# Patient Record
Sex: Female | Born: 1950 | Race: Black or African American | Hispanic: No | Smoking: Current every day smoker
Health system: Southern US, Community
[De-identification: ages and names within clinical notes are randomized; demographics above are authoritative.]

## PROBLEM LIST (undated history)

## (undated) DIAGNOSIS — I10 Essential (primary) hypertension: Secondary | ICD-10-CM

## (undated) DIAGNOSIS — I671 Cerebral aneurysm, nonruptured: Secondary | ICD-10-CM

## (undated) DIAGNOSIS — E119 Type 2 diabetes mellitus without complications: Secondary | ICD-10-CM

## (undated) HISTORY — DX: Essential (primary) hypertension: I10

## (undated) HISTORY — PX: OTHER SURGICAL HISTORY: SHX169

## (undated) HISTORY — PX: ABDOMINAL HYSTERECTOMY: SHX81

## (undated) HISTORY — DX: Type 2 diabetes mellitus without complications: E11.9

## (undated) HISTORY — DX: Cerebral aneurysm, nonruptured: I67.1

---

## 1998-06-08 DIAGNOSIS — I671 Cerebral aneurysm, nonruptured: Secondary | ICD-10-CM

## 1998-06-08 HISTORY — PX: BRAIN SURGERY: SHX531

## 1998-06-08 HISTORY — DX: Cerebral aneurysm, nonruptured: I67.1

## 1999-06-09 HISTORY — PX: VENTRICULOPERITONEAL SHUNT: SHX204

## 2013-03-07 ENCOUNTER — Encounter: Payer: Self-pay | Admitting: Family Medicine

## 2013-03-07 ENCOUNTER — Ambulatory Visit: Payer: Medicare Other | Attending: Family Medicine | Admitting: Family Medicine

## 2013-03-07 VITALS — BP 160/77 | HR 78 | Temp 98.7°F | Resp 18 | Ht 62.0 in | Wt 167.0 lb

## 2013-03-07 DIAGNOSIS — I10 Essential (primary) hypertension: Secondary | ICD-10-CM | POA: Insufficient documentation

## 2013-03-07 DIAGNOSIS — I671 Cerebral aneurysm, nonruptured: Secondary | ICD-10-CM

## 2013-03-07 DIAGNOSIS — E119 Type 2 diabetes mellitus without complications: Secondary | ICD-10-CM | POA: Insufficient documentation

## 2013-03-07 DIAGNOSIS — Z23 Encounter for immunization: Secondary | ICD-10-CM

## 2013-03-07 DIAGNOSIS — F17200 Nicotine dependence, unspecified, uncomplicated: Secondary | ICD-10-CM | POA: Insufficient documentation

## 2013-03-07 DIAGNOSIS — F411 Generalized anxiety disorder: Secondary | ICD-10-CM | POA: Insufficient documentation

## 2013-03-07 LAB — CBC
HCT: 39.3 % (ref 36.0–46.0)
Hemoglobin: 13.3 g/dL (ref 12.0–15.0)
MCH: 27.4 pg (ref 26.0–34.0)
MCHC: 33.8 g/dL (ref 30.0–36.0)
MCV: 81 fL (ref 78.0–100.0)
RDW: 14.7 % (ref 11.5–15.5)

## 2013-03-07 LAB — COMPLETE METABOLIC PANEL WITH GFR
AST: 11 U/L (ref 0–37)
Albumin: 4.5 g/dL (ref 3.5–5.2)
Alkaline Phosphatase: 103 U/L (ref 39–117)
BUN: 13 mg/dL (ref 6–23)
CO2: 31 mEq/L (ref 19–32)
Creat: 0.89 mg/dL (ref 0.50–1.10)
GFR, Est Non African American: 71 mL/min
Glucose, Bld: 95 mg/dL (ref 70–99)
Sodium: 140 mEq/L (ref 135–145)
Total Bilirubin: 0.3 mg/dL (ref 0.3–1.2)
Total Protein: 7.6 g/dL (ref 6.0–8.3)

## 2013-03-07 LAB — GLUCOSE, POCT (MANUAL RESULT ENTRY): POC Glucose: 188 mg/dl — AB (ref 70–99)

## 2013-03-07 LAB — LIPID PANEL
Cholesterol: 170 mg/dL (ref 0–200)
VLDL: 21 mg/dL (ref 0–40)

## 2013-03-07 NOTE — Progress Notes (Signed)
Patient ID: Jacqueline Dean, female   DOB: [birth year], 62 y.o.   MRN: [id number]  CC: Establish care  HPI: Patient is presenting as a new patient to the clinic.  She reports that her previous primary care physician has left town.  She reports that she has diabetes but is been well controlled with taking Actos 15 mg by mouth daily.  She reports that she has been taking her medications with no difficulty.  She reports that she hasn't had lab work done in quite some time.  It has been nearly one year.  She reports that she has a history of her brain aneurysm and had surgery and subsequently has been taking Dilantin every day.  This has been done to avoid seizures.  She reports that she has not experienced any seizure activity.  She reports that her previous physician changed the dose of her Dilantin somewhat but she's been taking it the same way for the past 15 years.  She did not change the dose.  She takes 4 tablets at night before bed.  The patient reports that she has not had any Dilantin levels done in many years.  She has not seen a neurologist and a very long time.  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  She reports that this has been working very well for her.    No Known Allergies Past Medical History  Diagnosis Date  . Diabetes mellitus without complication   . Hypertension   . Brain aneurysm 2000   No current outpatient prescriptions on file prior to visit.   No current facility-administered medications on file prior to visit.   Family History  Problem Relation Age of Onset  . Hypertension Mother   . Diabetes Mother   . Heart disease Mother   . Hypertension Sister    History   Social History  . Marital Status: Widowed    Spouse Name: N/A    Number of Children: N/A  . Years of Education: N/A   Occupational History  . Not on file.   Social History Main Topics  . Smoking status: Not  on file  . Smokeless tobacco: Not on file  . Alcohol Use: Not on file  . Drug Use: Not on file  . Sexual Activity: Not on file   Other Topics Concern  . Not on file   Social History Narrative  . No narrative on file    Review of Systems  Constitutional: Negative for fever, chills, diaphoresis, activity change, appetite change and fatigue.  HENT: Negative for ear pain, nosebleeds, congestion, facial swelling, rhinorrhea, neck pain, neck stiffness and ear discharge.   Eyes: Negative for pain, discharge, redness, itching and visual disturbance.  Respiratory: Negative for cough, choking, chest tightness, shortness of breath, wheezing and stridor.   Cardiovascular: Negative for chest pain, palpitations and leg swelling.  Gastrointestinal: Negative for abdominal distention.  Genitourinary: Negative for dysuria, urgency, frequency, hematuria, flank pain, decreased urine volume, difficulty urinating and dyspareunia.  Musculoskeletal: Negative for back pain, joint swelling, arthralgias and gait problem.  Neurological: Negative for dizziness, tremors, seizures, syncope, facial asymmetry, speech difficulty, weakness, light-headedness, numbness and headaches.  Hematological: Negative for adenopathy. Does not bruise/bleed easily.  Psychiatric/Behavioral: Negative for hallucinations, behavioral problems, confusion, dysphoric mood, decreased concentration and agitation.    Objective:   Filed Vitals:   03/07/13 1112  BP: 160/77  Pulse: 78  Temp: 98.7 F (37.1  C)  Resp: 18    Physical Exam  Constitutional: Appears well-developed and well-nourished. No distress.  HENT: Normocephalic. External right and left ear normal. Oropharynx is clear and moist.  Eyes: Conjunctivae and EOM are normal. PERRLA, no scleral icterus.  Neck: Normal ROM. Neck supple. No JVD. No tracheal deviation. No thyromegaly.  CVS: RRR, S1/S2 +, no murmurs, no gallops, no carotid bruit.  Pulmonary: Effort and breath sounds  normal, no stridor, rhonchi, wheezes, rales.  Abdominal: Soft. BS +,  no distension, tenderness, rebound or guarding.  Musculoskeletal: Normal range of motion. No edema and no tenderness.  Lymphadenopathy: No lymphadenopathy noted, cervical, inguinal. Neuro: Alert. Normal reflexes, muscle tone coordination. No cranial nerve deficit. Skin: Skin is warm and dry. No rash noted. Not diaphoretic. No erythema. No pallor.  Psychiatric: Normal mood and affect. Behavior, judgment, thought content normal.   No results found for this basename: WBC, HGB, HCT, MCV, PLT   No results found for this basename: CREATININE, BUN, NA, K, CL, CO2    Lab Results  Component Value Date   HGBA1C 5.7 03/07/2013   Lipid Panel  No results found for this basename: chol, trig, hdl, cholhdl, vldl, ldlcalc       Assessment and plan:   Patient Active Problem List   Diagnosis Date Noted  . Type II or unspecified type diabetes mellitus without mention of complication, not stated as uncontrolled 03/07/2013  . Unspecified essential hypertension 03/07/2013  . Brain aneurysm 03/07/2013  . Smoker 03/07/2013  . Generalized anxiety disorder 03/07/2013   Diabetes - Plan: HgB A1c, Glucose (CBG)  Type II or unspecified type diabetes mellitus without mention of complication, not stated as uncontrolled  Unspecified essential hypertension  Brain aneurysm  Smoker  Generalized anxiety disorder   The patient was counseled on the dangers of tobacco use, and was advised to quit.  Reviewed strategies to maximize success, including removing cigarettes and smoking materials from environment and stress management.  RTC in 3 weeks for repeat BP check and office visit  The patient was given clear instructions to go to ER or return to medical center if symptoms don't improve, worsen or new problems develop.  The patient verbalized understanding.  The patient was told to call to get any lab results if not heard anything in the  next week.    Rodney Langton, MD, CDE, FAAFP [facility] [place], [place]

## 2013-03-07 NOTE — Progress Notes (Signed)
Pt here to establish care  multiple medical history with medication Need a1c/cbg checked Medication changed and filled

## 2013-03-07 NOTE — Patient Instructions (Addendum)
Hypertension Hypertension is another name for high blood pressure. High blood pressure may mean that your heart needs to work harder to pump blood. Blood pressure consists of two numbers, which includes a higher number over a lower number (example: 110/72). HOME CARE   Make lifestyle changes as told by your doctor. This may include weight loss and exercise.  Take your blood pressure medicine every day.  Limit how much salt you use.  Stop smoking if you smoke.  Do not use drugs.  Talk to your doctor if you are using decongestants or birth control pills. These medicines might make blood pressure higher.  Females should not drink more than 1 alcoholic drink per day. Males should not drink more than 2 alcoholic drinks per day.  See your doctor as told. GET HELP RIGHT AWAY IF:   You have a blood pressure reading with a top number of 180 or higher.  You get a very bad headache.  You get blurred or changing vision.  You feel confused.  You feel weak, numb, or faint.  You get chest or belly (abdominal) pain.  You throw up (vomit).  You cannot breathe very well. MAKE SURE YOU:   Understand these instructions.  Will watch your condition.  Will get help right away if you are not doing well or get worse. Document Released: 11/11/2007 Document Revised: 08/17/2011 Document Reviewed: 11/11/2007 [facility] Patient Information 2014 [place], [place]. DASH Diet The DASH diet stands for "Dietary Approaches to Stop Hypertension." It is a healthy eating plan that has been shown to reduce high blood pressure (hypertension) in as little as 14 days, while also possibly providing other significant health benefits. These other health benefits include reducing the risk of breast cancer after menopause and reducing the risk of type 2 diabetes, heart disease, colon cancer, and stroke. Health benefits also include weight loss and slowing kidney failure in patients with chronic kidney disease.  DIET  GUIDELINES  Limit salt (sodium). Your diet should contain less than 1500 mg of sodium daily.  Limit refined or processed carbohydrates. Your diet should include mostly whole grains. Desserts and added sugars should be used sparingly.  Include small amounts of heart-healthy fats. These types of fats include nuts, oils, and tub margarine. Limit saturated and trans fats. These fats have been shown to be harmful in the body. CHOOSING FOODS  The following food groups are based on a 2000 calorie diet. See your Registered Dietitian for individual calorie needs. Grains and Grain Products (6 to 8 servings daily)  Eat More Often: Whole-wheat bread, brown rice, whole-grain or wheat pasta, quinoa, popcorn without added fat or salt (air popped).  Eat Less Often: White bread, white pasta, white rice, cornbread. Vegetables (4 to 5 servings daily)  Eat More Often: Fresh, frozen, and canned vegetables. Vegetables may be raw, steamed, roasted, or grilled with a minimal amount of fat.  Eat Less Often/Avoid: Creamed or fried vegetables. Vegetables in a cheese sauce. Fruit (4 to 5 servings daily)  Eat More Often: All fresh, canned (in natural juice), or frozen fruits. Dried fruits without added sugar. One hundred percent fruit juice ( cup [237 mL] daily).  Eat Less Often: Dried fruits with added sugar. Canned fruit in light or heavy syrup. [employer], Fish, and Poultry (2 servings or less daily. One serving is 3 to 4 oz [85-114 g]).  Eat More Often: Ninety percent or leaner ground beef, tenderloin, sirloin. Round cuts of beef, chicken breast, [place] breast. All fish. Grill, bake,  or broil your meat. Nothing should be fried.  Eat Less Often/Avoid: Fatty cuts of meat, [place], or chicken leg, thigh, or wing. Fried cuts of meat or fish. Dairy (2 to 3 servings)  Eat More Often: Low-fat or fat-free milk, low-fat plain or light yogurt, reduced-fat or part-skim cheese.  Eat Less Often/Avoid: Milk (whole,  2%).Whole milk yogurt. Full-fat cheeses. Nuts, Seeds, and Legumes (4 to 5 servings per week)  Eat More Often: All without added salt.  Eat Less Often/Avoid: Salted nuts and seeds, canned beans with added salt. Fats and Sweets (limited)  Eat More Often: Vegetable oils, tub margarines without trans fats, sugar-free gelatin. Mayonnaise and salad dressings.  Eat Less Often/Avoid: Coconut oils, palm oils, butter, stick margarine, cream, half and half, cookies, candy, pie. FOR MORE INFORMATION The Dash Diet Eating Plan: www.dashdiet.org Document Released: 05/14/2011 Document Revised: 08/17/2011 Document Reviewed: 05/14/2011 [facility] Patient Information 2014 [place], [place]. Smoking Cessation Quitting smoking is important to your health and has many advantages. However, it is not always easy to quit since nicotine is a very addictive drug. Often times, people try 3 times or more before being able to quit. This document explains the best ways for you to prepare to quit smoking. Quitting takes hard work and a lot of effort, but you can do it. ADVANTAGES OF QUITTING SMOKING  You will live longer, feel better, and live better.  Your body will feel the impact of quitting smoking almost immediately.  Within 20 minutes, blood pressure decreases. Your pulse returns to its normal level.  After 8 hours, carbon monoxide levels in the blood return to normal. Your oxygen level increases.  After 24 hours, the chance of having a heart attack starts to decrease. Your breath, hair, and body stop smelling like smoke.  After 48 hours, damaged nerve endings begin to recover. Your sense of taste and smell improve.  After 72 hours, the body is virtually free of nicotine. Your bronchial tubes relax and breathing becomes easier.  After 2 to 12 weeks, lungs can hold more air. Exercise becomes easier and circulation improves.  The risk of having a heart attack, stroke, cancer, or lung disease is greatly  reduced.  After 1 year, the risk of coronary heart disease is cut in half.  After 5 years, the risk of stroke falls to the same as a nonsmoker.  After 10 years, the risk of lung cancer is cut in half and the risk of other cancers decreases significantly.  After 15 years, the risk of coronary heart disease drops, usually to the level of a nonsmoker.  If you are pregnant, quitting smoking will improve your chances of having a healthy baby.  The people you live with, especially any children, will be healthier.  You will have extra money to spend on things other than cigarettes. QUESTIONS TO THINK ABOUT BEFORE ATTEMPTING TO QUIT You may want to talk about your answers with your caregiver.  Why do you want to quit?  If you tried to quit in the past, what helped and what did not?  What will be the most difficult situations for you after you quit? How will you plan to handle them?  Who can help you through the tough times? Your family? Friends? A caregiver?  What pleasures do you get from smoking? What ways can you still get pleasure if you quit? Here are some questions to ask your caregiver:  How can you help me to be successful at quitting?  What medicine do  you think would be best for me and how should I take it?  What should I do if I need more help?  What is smoking withdrawal like? How can I get information on withdrawal? GET READY  Set a quit date.  Change your environment by getting rid of all cigarettes, ashtrays, matches, and lighters in your home, car, or work. Do not let people smoke in your home.  Review your past attempts to quit. Think about what worked and what did not. GET SUPPORT AND ENCOURAGEMENT You have a better chance of being successful if you have help. You can get support in many ways.  Tell your family, friends, and co-workers that you are going to quit and need their support. Ask them not to smoke around you.  Get individual, group, or telephone  counseling and support. Programs are available at [employer] and health centers. Call your local health department for information about programs in your area.  Spiritual beliefs and practices may help some smokers quit.  Download a "quit meter" on your computer to keep track of quit statistics, such as how long you have gone without smoking, cigarettes not smoked, and money saved.  Get a self-help book about quitting smoking and staying off of tobacco. LEARN NEW SKILLS AND BEHAVIORS  Distract yourself from urges to smoke. Talk to someone, go for a walk, or occupy your time with a task.  Change your normal routine. Take a different route to work. Drink tea instead of coffee. Eat breakfast in a different place.  Reduce your stress. Take a hot bath, exercise, or read a book.  Plan something enjoyable to do every day. Reward yourself for not smoking.  Explore interactive web-based programs that specialize in helping you quit. GET MEDICINE AND USE IT CORRECTLY Medicines can help you stop smoking and decrease the urge to smoke. Combining medicine with the above behavioral methods and support can greatly increase your chances of successfully quitting smoking.  Nicotine replacement therapy helps deliver nicotine to your body without the negative effects and risks of smoking. Nicotine replacement therapy includes nicotine gum, lozenges, inhalers, nasal sprays, and skin patches. Some may be available over-the-counter and others require a prescription.  Antidepressant medicine helps people abstain from smoking, but how this works is unknown. This medicine is available by prescription.  Nicotinic receptor partial agonist medicine simulates the effect of nicotine in your brain. This medicine is available by prescription. Ask your caregiver for advice about which medicines to use and how to use them based on your health history. Your caregiver will tell you what side effects to look out for if you  choose to be on a medicine or therapy. Carefully read the information on the package. Do not use any other product containing nicotine while using a nicotine replacement product.  RELAPSE OR DIFFICULT SITUATIONS Most relapses occur within the first 3 months after quitting. Do not be discouraged if you start smoking again. Remember, most people try several times before finally quitting. You may have symptoms of withdrawal because your body is used to nicotine. You may crave cigarettes, be irritable, feel very hungry, cough often, get headaches, or have difficulty concentrating. The withdrawal symptoms are only temporary. They are strongest when you first quit, but they will go away within 10 14 days. To reduce the chances of relapse, try to:  Avoid drinking alcohol. Drinking lowers your chances of successfully quitting.  Reduce the amount of caffeine you consume. Once you quit smoking, the amount  of caffeine in your body increases and can give you symptoms, such as a rapid heartbeat, sweating, and anxiety.  Avoid smokers because they can make you want to smoke.  Do not let weight gain distract you. Many smokers will gain weight when they quit, usually less than 10 pounds. Eat a healthy diet and stay active. You can always lose the weight gained after you quit.  Find ways to improve your mood other than smoking. FOR MORE INFORMATION  www.smokefree.gov  Document Released: 05/19/2001 Document Revised: 11/24/2011 Document Reviewed: 09/03/2011 [facility] Patient Information 2014 [place], [place]. Smoking Cessation, Tips for Success YOU CAN QUIT SMOKING If you are ready to quit smoking, congratulations! You have chosen to help yourself be healthier. Cigarettes bring nicotine, tar, carbon monoxide, and other irritants into your body. Your lungs, heart, and blood vessels will be able to work better without these poisons. There are many different ways to quit smoking. Nicotine gum, nicotine patches, a  nicotine inhaler, or nicotine nasal spray can help with physical craving. Hypnosis, support groups, and medicines help break the habit of smoking. Here are some tips to help you quit for good.  Throw away all cigarettes.  Clean and remove all ashtrays from your home, work, and car.  On a card, write down your reasons for quitting. Carry the card with you and read it when you get the urge to smoke.  Cleanse your body of nicotine. Drink enough water and fluids to keep your urine clear or pale yellow. Do this after quitting to flush the nicotine from your body.  Learn to predict your moods. Do not let a bad situation be your excuse to have a cigarette. Some situations in your life might tempt you into wanting a cigarette.  Never have "just one" cigarette. It leads to wanting another and another. Remind yourself of your decision to quit.  Change habits associated with smoking. If you smoked while driving or when feeling stressed, try other activities to replace smoking. Stand up when drinking your coffee. Brush your teeth after eating. Sit in a different chair when you read the paper. Avoid alcohol while trying to quit, and try to drink fewer caffeinated beverages. Alcohol and caffeine may urge you to smoke.  Avoid foods and drinks that can trigger a desire to smoke, such as sugary or spicy foods and alcohol.  Ask people who smoke not to smoke around you.  Have something planned to do right after eating or having a cup of coffee. Take a walk or exercise to perk you up. This will help to keep you from overeating.  Try a relaxation exercise to calm you down and decrease your stress. Remember, you may be tense and nervous for the first 2 weeks after you quit, but this will pass.  Find new activities to keep your hands busy. Play with a pen, coin, or rubber band. Doodle or draw things on paper.  Brush your teeth right after eating. This will help cut down on the craving for the taste of tobacco  after meals. You can try mouthwash, too.  Use oral substitutes, such as lemon drops, carrots, a cinnamon stick, or chewing gum, in place of cigarettes. Keep them handy so they are available when you have the urge to smoke.  When you have the urge to smoke, try deep breathing.  Designate your home as a nonsmoking area.  If you are a heavy smoker, ask your caregiver about a prescription for nicotine chewing gum. It can ease  your withdrawal from nicotine.  Reward yourself. Set aside the cigarette money you save and buy yourself something nice.  Look for support from others. Join a support group or smoking cessation program. Ask someone at home or at work to help you with your plan to quit smoking.  Always ask yourself, "Do I need this cigarette or is this just a reflex?" Tell yourself, "Today, I choose not to smoke," or "I do not want to smoke." You are reminding yourself of your decision to quit, even if you do smoke a cigarette. HOW WILL I FEEL WHEN I QUIT SMOKING?  The benefits of not smoking start within days of quitting.  You may have symptoms of withdrawal because your body is used to nicotine (the addictive substance in cigarettes). You may crave cigarettes, be irritable, feel very hungry, cough often, get headaches, or have difficulty concentrating.  The withdrawal symptoms are only temporary. They are strongest when you first quit but will go away within 10 to 14 days.  When withdrawal symptoms occur, stay in control. Think about your reasons for quitting. Remind yourself that these are signs that your body is healing and getting used to being without cigarettes.  Remember that withdrawal symptoms are easier to treat than the major diseases that smoking can cause.  Even after the withdrawal is over, expect periodic urges to smoke. However, these cravings are generally short-lived and will go away whether you smoke or not. Do not smoke!  If you relapse and smoke again, do not lose  hope. Most smokers quit 3 times before they are successful.  If you relapse, do not give up! Plan ahead and think about what you will do the next time you get the urge to smoke. LIFE AS A NONSMOKER: MAKE IT FOR A MONTH, MAKE IT FOR LIFE Day 1: Hang this page where you will see it every day. Day 2: Get rid of all ashtrays, matches, and lighters. Day 3: Drink water. Breathe deeply between sips. Day 4: Avoid places with smoke-filled air, such as bars, clubs, or the smoking section of restaurants. Day 5: Keep track of how much money you save by not smoking. Day 6: Avoid boredom. Keep a good book with you or go to the movies. Day 7: Reward yourself! One week without smoking! Day 8: Make a dental appointment to get your teeth cleaned. Day 9: Decide how you will turn down a cigarette before it is offered to you. Day 10: Review your reasons for quitting. Day 11: Distract yourself. Stay active to keep your mind off smoking and to relieve tension. Take a walk, exercise, read a book, do a crossword puzzle, or try a new hobby. Day 12: Exercise. Get off the bus before your stop or use stairs instead of escalators. Day 13: Call on friends for support and encouragement. Day 14: Reward yourself! Two weeks without smoking! Day 15: Practice deep breathing exercises. Day 16: Bet a friend that you can stay a nonsmoker. Day 17: Ask to sit in nonsmoking sections of restaurants. Day 18: Hang up "No Smoking" signs. Day 19: Think of yourself as a nonsmoker. Day 20: Each morning, tell yourself you will not smoke. Day 21: Reward yourself! Three weeks without smoking! Day 22: Think of smoking in negative ways. Remember how it stains your teeth, gives you bad breath, and leaves you short of breath. Day 23: Eat a nutritious breakfast. Day 24:Do not relive your days as a smoker. Day 25: Hold a pencil in your  hand when talking on the telephone. Day 26: Tell all your friends you do not smoke. Day 27: Think about how  much better food tastes. Day 28: Remember, one cigarette is one too many. Day 29: Take up a hobby that will keep your hands busy. Day 30: Congratulations! One month without smoking! Give yourself a big reward. Your caregiver can direct you to community resources or hospitals for support, which may include:  Group support.  Education.  Hypnosis.  Subliminal therapy. Document Released: 02/21/2004 Document Revised: 08/17/2011 Document Reviewed: 03/11/2009 [facility] Patient Information 2014 [place], [place].

## 2013-03-08 LAB — MICROALBUMIN / CREATININE URINE RATIO: Creatinine, Urine: 92.7 mg/dL

## 2013-03-08 LAB — TSH: TSH: 1.177 u[IU]/mL (ref 0.350–4.500)

## 2013-03-12 LAB — PHENYTOIN LEVEL, FREE AND TOTAL
Phenytoin, Free: <0.5 mg/L — ABNORMAL LOW (ref 1.0–2.0)
Phenytoin, Total: <2.5 mg/L — ABNORMAL LOW (ref 10.0–20.0)

## 2013-03-15 ENCOUNTER — Telehealth: Payer: Self-pay | Admitting: Emergency Medicine

## 2013-03-15 NOTE — Telephone Encounter (Signed)
Message copied by Darlis Loan on Wed Mar 15, 2013 11:50 AM ------      Message from: Cleora Fleet      Created: Wed Mar 15, 2013 11:24 AM       Please inform patient that her labs came back ok except that her dilantin levels came back very low.  I am wondering if patient is taking the dilantin or if patient has missed some doses.              Rodney Langton, MD, CDE, FAAFP      [facility]      [facility]      [place], [place]        ------

## 2013-03-15 NOTE — Progress Notes (Signed)
Spoke with regarding labs and low dilantin levels. States she was out of medication x 1 week but has restarted 03/10/13. Denies seizure activity

## 2013-03-15 NOTE — Progress Notes (Signed)
Quick Note:  Please inform patient that her labs came back ok except that her dilantin levels came back very low. I am wondering if patient is taking the dilantin or if patient has missed some doses.   Rodney Langton, MD, CDE, FAAFP [facility] [place], [place]   ______

## 2013-03-15 NOTE — Telephone Encounter (Signed)
Spoke with pt regarding low Dilantin levels. States she was out of medicine x 1 week but has restarted 03/10/13 No seizures noted

## 2013-03-28 ENCOUNTER — Ambulatory Visit: Payer: PRIVATE HEALTH INSURANCE | Attending: Internal Medicine | Admitting: Internal Medicine

## 2013-03-28 VITALS — BP 126/77 | HR 90 | Temp 98.8°F | Resp 16 | Ht 64.0 in | Wt 168.0 lb

## 2013-03-28 DIAGNOSIS — I10 Essential (primary) hypertension: Secondary | ICD-10-CM | POA: Insufficient documentation

## 2013-03-28 DIAGNOSIS — Z0000 Encounter for general adult medical examination without abnormal findings: Secondary | ICD-10-CM

## 2013-03-28 DIAGNOSIS — E119 Type 2 diabetes mellitus without complications: Secondary | ICD-10-CM | POA: Insufficient documentation

## 2013-03-28 NOTE — Progress Notes (Signed)
Pt is here for a f/u visit. Pt is here for diabetes management. Pt has no C.C. today

## 2013-03-28 NOTE — Progress Notes (Signed)
Patient ID: Jacqueline Dean, female   DOB: July 09, 1950, 62 y.o.   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 Blood work done that showed subtherapeutic Dilantin level and was called  for followup. It appears that she was not taking her Dilantin regularly and had not seen a physician or her neurologist for a long time. Blood work showed hemoglobin A1c of 5.7. Her blood pressure was also elevated at that time and it appears she was not taking her medications on to that visit. She has now been regular with her medications and her blood pressure is quite stable today. She denies any headache, blurred vision, dizziness, tingling or numbness of her extremities, chest pain, palpitations, shortness of breath, bowel or urinary symptoms, abdominal pain, nausea or vomiting.  Vital signs in last 24 hours:  Filed Vitals:   03/28/13 0910  BP: 126/77  Pulse: 90  Temp: 98.8 F (37.1 C)  TempSrc: Oral  Resp: 16  Height: 5\' 4"  (1.626 m)  Weight: 168 lb (76.204 kg)  SpO2: 97%     Physical Exam:  General: Middle aged female in no acute distress. HEENT: no pallor, no icterus, moist oral mucosa,  Heart: Normal  s1 &s2  Regular rate and rhythm,  Lungs: Clear to auscultation bilaterally. Extremities: Warm, no edema Neuro: Alert, awake, oriented x3, nonfocal.   Lab Results:  Basic Metabolic Panel:    Component Value Date/Time   NA 140 03/07/2013 1138   K 3.9 03/07/2013 1138   CL 103 03/07/2013 1138   CO2 31 03/07/2013 1138   BUN 13 03/07/2013 1138   CREATININE 0.89 03/07/2013 1138   GLUCOSE 95 03/07/2013 1138   CALCIUM 10.1 03/07/2013 1138   CBC:    Component Value Date/Time   WBC 5.2 03/07/2013 1138   HGB 13.3 03/07/2013 1138   HCT 39.3 03/07/2013 1138   PLT 295 03/07/2013 1138   MCV 81.0 03/07/2013 1138    No results found for this or any previous visit (from the  past 240 hour(s)).  Studies/Results: No results found.  Medications: Scheduled Meds: Continuous Infusions: PRN Meds:.    Assessment/Plan: Subtherapeutic Dilantin level Route recheck Dilantin level today. Patient reports being compliant with her medications since her last visit 3 weeks back. If level not therapeutic we will call her to adjust her  dose.  Diabetes mellitus A1c of 5.7. Continue  metformin  Hypertension BP is stable today. Continue losartan  Health maintenance Patient reports being up-to-date with her Pap and mammogram. She also received a flu vaccine recently. Followup in 3 months.   Fedor Kazmierski 03/28/2013, 9:56 AM

## 2013-04-01 LAB — PHENYTOIN LEVEL, FREE AND TOTAL
Phenytoin Bound: 6.2 mg/L
Phenytoin, Free: 0.8 mg/L — ABNORMAL LOW (ref 1.0–2.0)

## 2013-06-17 ENCOUNTER — Emergency Department (HOSPITAL_COMMUNITY): Payer: PRIVATE HEALTH INSURANCE

## 2013-06-17 ENCOUNTER — Emergency Department (HOSPITAL_COMMUNITY)
Admission: EM | Admit: 2013-06-17 | Discharge: 2013-06-17 | Disposition: A | Discharge status: Home / Self Care | Payer: PRIVATE HEALTH INSURANCE | Attending: Emergency Medicine | Admitting: Emergency Medicine

## 2013-06-17 ENCOUNTER — Encounter (HOSPITAL_COMMUNITY): Payer: Self-pay | Admitting: Emergency Medicine

## 2013-06-17 DIAGNOSIS — M542 Cervicalgia: Secondary | ICD-10-CM

## 2013-06-17 DIAGNOSIS — Z79899 Other long term (current) drug therapy: Secondary | ICD-10-CM | POA: Insufficient documentation

## 2013-06-17 DIAGNOSIS — Y9389 Activity, other specified: Secondary | ICD-10-CM | POA: Insufficient documentation

## 2013-06-17 DIAGNOSIS — S199XXA Unspecified injury of neck, initial encounter: Principal | ICD-10-CM

## 2013-06-17 DIAGNOSIS — E119 Type 2 diabetes mellitus without complications: Secondary | ICD-10-CM | POA: Insufficient documentation

## 2013-06-17 DIAGNOSIS — Y92410 Unspecified street and highway as the place of occurrence of the external cause: Secondary | ICD-10-CM | POA: Insufficient documentation

## 2013-06-17 DIAGNOSIS — R51 Headache: Secondary | ICD-10-CM

## 2013-06-17 DIAGNOSIS — F17200 Nicotine dependence, unspecified, uncomplicated: Secondary | ICD-10-CM | POA: Insufficient documentation

## 2013-06-17 DIAGNOSIS — Z982 Presence of cerebrospinal fluid drainage device: Secondary | ICD-10-CM | POA: Insufficient documentation

## 2013-06-17 DIAGNOSIS — S0993XA Unspecified injury of face, initial encounter: Secondary | ICD-10-CM | POA: Insufficient documentation

## 2013-06-17 DIAGNOSIS — I10 Essential (primary) hypertension: Secondary | ICD-10-CM | POA: Insufficient documentation

## 2013-06-17 DIAGNOSIS — R519 Headache, unspecified: Secondary | ICD-10-CM

## 2013-06-17 DIAGNOSIS — S0990XA Unspecified injury of head, initial encounter: Secondary | ICD-10-CM | POA: Insufficient documentation

## 2013-06-17 MED ORDER — HYDROCODONE-ACETAMINOPHEN 5-325 MG PO TABS: 1.0000 | ORAL_TABLET | Freq: Four times a day (QID) | ORAL | Status: DC | PRN
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
## 2013-06-17 NOTE — ED Notes (Signed)
MD at bedside. 
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
## 2013-06-17 NOTE — ED Notes (Signed)
Patient transported to CT 
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
## 2013-06-28 ENCOUNTER — Ambulatory Visit: Payer: Medicare Other | Admitting: Internal Medicine

## 2013-08-18 ENCOUNTER — Ambulatory Visit: Payer: PRIVATE HEALTH INSURANCE | Admitting: Diagnostic Neuroimaging

## 2013-10-21 ENCOUNTER — Inpatient Hospital Stay (HOSPITAL_COMMUNITY)
Admission: EM | Admit: 2013-10-21 | Discharge: 2013-10-24 | DRG: 378 | Disposition: A | Discharge status: Home / Self Care | Payer: PRIVATE HEALTH INSURANCE | Attending: Family Medicine | Admitting: Family Medicine

## 2013-10-21 ENCOUNTER — Encounter (HOSPITAL_COMMUNITY): Payer: Self-pay | Admitting: Emergency Medicine

## 2013-10-21 ENCOUNTER — Emergency Department (INDEPENDENT_AMBULATORY_CARE_PROVIDER_SITE_OTHER)
Admission: EM | Admit: 2013-10-21 | Discharge: 2013-10-21 | Disposition: A | Discharge status: Nursing Home (Includes ICF) | Payer: PRIVATE HEALTH INSURANCE | Source: Home / Self Care | Attending: Emergency Medicine | Admitting: Emergency Medicine

## 2013-10-21 DIAGNOSIS — F411 Generalized anxiety disorder: Secondary | ICD-10-CM | POA: Diagnosis present

## 2013-10-21 DIAGNOSIS — R358 Other polyuria: Secondary | ICD-10-CM | POA: Diagnosis present

## 2013-10-21 DIAGNOSIS — D649 Anemia, unspecified: Secondary | ICD-10-CM

## 2013-10-21 DIAGNOSIS — I671 Cerebral aneurysm, nonruptured: Secondary | ICD-10-CM

## 2013-10-21 DIAGNOSIS — K921 Melena: Principal | ICD-10-CM | POA: Diagnosis present

## 2013-10-21 DIAGNOSIS — K5900 Constipation, unspecified: Secondary | ICD-10-CM | POA: Diagnosis present

## 2013-10-21 DIAGNOSIS — D62 Acute posthemorrhagic anemia: Secondary | ICD-10-CM | POA: Diagnosis present

## 2013-10-21 DIAGNOSIS — D126 Benign neoplasm of colon, unspecified: Secondary | ICD-10-CM

## 2013-10-21 DIAGNOSIS — Z8249 Family history of ischemic heart disease and other diseases of the circulatory system: Secondary | ICD-10-CM

## 2013-10-21 DIAGNOSIS — K648 Other hemorrhoids: Secondary | ICD-10-CM | POA: Diagnosis present

## 2013-10-21 DIAGNOSIS — Z833 Family history of diabetes mellitus: Secondary | ICD-10-CM

## 2013-10-21 DIAGNOSIS — I10 Essential (primary) hypertension: Secondary | ICD-10-CM

## 2013-10-21 DIAGNOSIS — R3589 Other polyuria: Secondary | ICD-10-CM | POA: Diagnosis present

## 2013-10-21 DIAGNOSIS — K922 Gastrointestinal hemorrhage, unspecified: Secondary | ICD-10-CM

## 2013-10-21 DIAGNOSIS — Z823 Family history of stroke: Secondary | ICD-10-CM

## 2013-10-21 DIAGNOSIS — Z79899 Other long term (current) drug therapy: Secondary | ICD-10-CM

## 2013-10-21 DIAGNOSIS — Z7982 Long term (current) use of aspirin: Secondary | ICD-10-CM

## 2013-10-21 DIAGNOSIS — E119 Type 2 diabetes mellitus without complications: Secondary | ICD-10-CM | POA: Diagnosis present

## 2013-10-21 DIAGNOSIS — Z982 Presence of cerebrospinal fluid drainage device: Secondary | ICD-10-CM

## 2013-10-21 DIAGNOSIS — F17200 Nicotine dependence, unspecified, uncomplicated: Secondary | ICD-10-CM | POA: Diagnosis present

## 2013-10-21 LAB — COMPREHENSIVE METABOLIC PANEL
ALBUMIN: 4 g/dL (ref 3.5–5.2)
ALK PHOS: 116 U/L (ref 39–117)
ALT: 21 U/L (ref 0–35)
AST: 18 U/L (ref 0–37)
BUN: 9 mg/dL (ref 6–23)
CO2: 24 mEq/L (ref 19–32)
Calcium: 9.4 mg/dL (ref 8.4–10.5)
Chloride: 99 mEq/L (ref 96–112)
Creatinine, Ser: 0.69 mg/dL (ref 0.50–1.10)
GFR calc Af Amer: >90 mL/min (ref >90)
GFR calc non Af Amer: >90 mL/min (ref >90)
Glucose, Bld: 112 mg/dL — ABNORMAL HIGH (ref 70–99)
POTASSIUM: 3.9 meq/L (ref 3.7–5.3)
Sodium: 136 mEq/L — ABNORMAL LOW (ref 137–147)
TOTAL PROTEIN: 8 g/dL (ref 6.0–8.3)
Total Bilirubin: <0.2 mg/dL — ABNORMAL LOW (ref 0.3–1.2)

## 2013-10-21 LAB — CBC
HCT: 21.8 % — ABNORMAL LOW (ref 36.0–46.0)
Hemoglobin: 6.8 g/dL — CL (ref 12.0–15.0)
MCH: 25.2 pg — ABNORMAL LOW (ref 26.0–34.0)
MCHC: 31.2 g/dL (ref 30.0–36.0)
MCV: 80.7 fL (ref 78.0–100.0)
Platelets: 592 10*3/uL — ABNORMAL HIGH (ref 150–400)
RBC: 2.7 MIL/uL — ABNORMAL LOW (ref 3.87–5.11)
RDW: 15.5 % (ref 11.5–15.5)
WBC: 8.1 10*3/uL (ref 4.0–10.5)

## 2013-10-21 LAB — APTT: APTT: 31 s (ref 24–37)

## 2013-10-21 LAB — PHENYTOIN LEVEL, TOTAL: Phenytoin Lvl: 10.9 ug/mL (ref 10.0–20.0)

## 2013-10-21 LAB — POCT I-STAT, CHEM 8
BUN: 7 mg/dL (ref 6–23)
CALCIUM ION: 1.21 mmol/L (ref 1.13–1.30)
Chloride: 104 mEq/L (ref 96–112)
Creatinine, Ser: 0.8 mg/dL (ref 0.50–1.10)
GLUCOSE: 101 mg/dL — AB (ref 70–99)
HEMATOCRIT: 23 % — AB (ref 36.0–46.0)
Hemoglobin: 7.8 g/dL — ABNORMAL LOW (ref 12.0–15.0)
Potassium: 3.9 mEq/L (ref 3.7–5.3)
Sodium: 138 mEq/L (ref 137–147)
TCO2: 23 mmol/L (ref 0–100)

## 2013-10-21 LAB — PREPARE RBC (CROSSMATCH)

## 2013-10-21 LAB — ABO/RH: ABO/RH(D): O POS

## 2013-10-21 LAB — PROTIME-INR
INR: 1.04 (ref 0.00–1.49)
Prothrombin Time: 13.4 seconds (ref 11.6–15.2)

## 2013-10-21 MED ORDER — SODIUM CHLORIDE 0.9 % IV SOLN
80.0000 mg | Freq: Once | INTRAVENOUS | Status: AC
  Administered 2013-10-21: 80 mg via INTRAVENOUS
  Filled 2013-10-21: qty 80

## 2013-10-21 MED ORDER — INSULIN ASPART 100 UNIT/ML ~~LOC~~ SOLN
0.0000 [IU] | SUBCUTANEOUS | Status: DC
  Administered 2013-10-22: 1 [IU] via SUBCUTANEOUS
  Filled 2013-10-21: qty 1

## 2013-10-21 MED ORDER — PHENYTOIN SODIUM EXTENDED 100 MG PO CAPS: 100.0000 mg | ORAL_CAPSULE | Freq: Two times a day (BID) | ORAL | Status: DC

## 2013-10-21 MED ORDER — PHENYTOIN SODIUM EXTENDED 100 MG PO CAPS
100.0000 mg | ORAL_CAPSULE | Freq: Every day | ORAL | Status: DC
  Administered 2013-10-22 – 2013-10-23 (×2): 100 mg via ORAL
  Filled 2013-10-21 (×3): qty 1

## 2013-10-21 MED ORDER — SODIUM CHLORIDE 0.9 % IJ SOLN
3.0000 mL | Freq: Two times a day (BID) | INTRAMUSCULAR | Status: DC
  Administered 2013-10-22 – 2013-10-23 (×3): 3 mL via INTRAVENOUS

## 2013-10-21 MED ORDER — DILTIAZEM HCL ER COATED BEADS 240 MG PO CP24
240.0000 mg | ORAL_CAPSULE | Freq: Every day | ORAL | Status: DC
  Administered 2013-10-22 – 2013-10-24 (×3): 240 mg via ORAL
  Filled 2013-10-21 (×3): qty 1

## 2013-10-21 MED ORDER — SODIUM CHLORIDE 0.9 % IV SOLN
8.0000 mg/h | INTRAVENOUS | Status: DC
  Administered 2013-10-21 – 2013-10-22 (×2): 8 mg/h via INTRAVENOUS
  Filled 2013-10-21 (×3): qty 80

## 2013-10-21 MED ORDER — BUSPIRONE HCL 15 MG PO TABS
15.0000 mg | ORAL_TABLET | Freq: Three times a day (TID) | ORAL | Status: DC
  Administered 2013-10-21 – 2013-10-24 (×8): 15 mg via ORAL
  Filled 2013-10-21 (×10): qty 1

## 2013-10-21 MED ORDER — PHENYTOIN SODIUM EXTENDED 100 MG PO CAPS
400.0000 mg | ORAL_CAPSULE | Freq: Every day | ORAL | Status: DC
  Administered 2013-10-21 – 2013-10-23 (×3): 400 mg via ORAL
  Filled 2013-10-21 (×4): qty 4

## 2013-10-21 NOTE — ED Notes (Signed)
She was sent from [facility] for rectal bleeding. She states her doctor started her on daily ASA several months ago and has noticed "pink tinged stool" since. She went to [facility] today for fatigue and they found her HGB and HCT were decreased. She is A&Ox4, no pain

## 2013-10-21 NOTE — ED Provider Notes (Signed)
CSN: [id number]     Arrival date & time 10/21/13  1151 History   First MD Initiated Contact with Patient 10/21/13 1220     Chief Complaint  Patient presents with  . Weakness   (Consider location/radiation/quality/duration/timing/severity/associated sxs/prior Treatment) HPI  63 year old F who presents with a chief complaint of fatigue and constipation. This has been going on since Tuesday, and she believes that this is related to her potassium. The patient states that she has had problems with potassium and her past and had similar symptoms. It has not occurred for several years. She was previously taking potassium supplements. When asked about additional symptoms, the patient responded "a little bit of everything." I asked her to be more specific and she stated she is also having cramps in her lower airways, sweating, and frequent urination. She also notes very dark stool, but denies rectal bleeding. She does not have a uterus due to partial hysterectomy in 1990 for fibroid tumor. She denies nausea, vomiting, fevers, chest pain, shortness of breath, or dysuria.   Past Medical History  Diagnosis Date  . Diabetes mellitus without complication   . Hypertension   . Brain aneurysm 2000   Past Surgical History  Procedure Laterality Date  . Abdominal hysterectomy      partial  . Brain surgery  2000  . Goiter    . Ventriculoperitoneal shunt  2001   Family History  Problem Relation Age of Onset  . Hypertension Mother   . Diabetes Mother   . Heart disease Mother   . Hypertension Sister    History  Substance Use Topics  . Smoking status: Current Every Day Smoker    Types: Cigarettes  . Smokeless tobacco: Not on file  . Alcohol Use: No   OB History   Grav Para Term Preterm Abortions TAB SAB Ect Mult Living                 Review of Systems She denies nausea, vomiting, fevers, chest pain, shortness of breath, or dysuria.  Allergies  Review of patient's allergies indicates no  known allergies.  Home Medications   Prior to Admission medications   Medication Sig Start Date End Date Taking? Authorizing Provider  busPIRone (BUSPAR) 15 MG tablet Take 15 mg by mouth 3 (three) times daily.    Historical Provider, MD  diltiazem (CARDIZEM CD) 240 MG 24 hr capsule Take 240 mg by mouth daily.    Historical Provider, MD  hydrochlorothiazide (HYDRODIURIL) 25 MG tablet Take 25 mg by mouth daily.    Historical Provider, MD  HYDROcodone-acetaminophen (NORCO/VICODIN) 5-325 MG per tablet Take 1 tablet by mouth every 6 (six) hours as needed (for severe pain). 06/17/13   Elmer Sow, MD  losartan (COZAAR) 50 MG tablet Take 50 mg by mouth daily.    Historical Provider, MD  phenytoin (DILANTIN) 100 MG ER capsule Take by mouth 2 (two) times daily.    Historical Provider, MD  pioglitazone (ACTOS) 30 MG tablet Take 30 mg by mouth daily.    Historical Provider, MD   BP 115/52  Pulse 89  Temp(Src) 98.8 F (37.1 C) (Oral)  Resp 18  SpO2 100% Physical Exam Gen: milddle aged Keyser female, well appearing, NAD, HEENT: NCAT, PERRLA, EOMI, OP clear and moist, no oropharyngeal exudate, no lymphadenopathy, no thyroid tenderness, enlargement, or nodules,  CV: RRR, no m/r/g, no JVD or carotid bruits Pulm: normal WOB, CTA-B Abd: distended, palpable stool burden, non tender Rectal: lots of stool in rectal  vault, melena Extremities: no edema or joint tenderness Skin: warm, dry, no rashes Neuro/Psych: A&Ox4, normal affect, speech, and thought content  ED Course  Procedures (including critical care time) Labs Review Labs Reviewed  POCT I-STAT, CHEM 8 - Abnormal; Notable for the following:    Glucose, Bld 101 (*)    Hemoglobin 7.8 (*)    HCT 23.0 (*)    All other components within normal limits    Imaging Review No results found.   MDM   1. GI bleed   2. Anemia   3. Constipation    Severe anemia with melena and heme + stool in patient greater than 74 years old is colon cancer until  proven otherwise. I will send her to the [facility] ED for a CT scan and CBC check. This may be due to aspirin use causing upper GI bleed and/or a confounding effect of the dilantin, but more importantly, the patient needs a thorough GI evaluation. This situation was discussed in detail with the patient, and she is agreeable to going to the ED.     Angelica Ran, MD 10/21/13 1440

## 2013-10-21 NOTE — ED Notes (Signed)
CBG 85

## 2013-10-21 NOTE — ED Notes (Signed)
Pt  Reports        Symptoms   Of  Weakness         She  thnks  Her potassium  May  Be  Low    She  Also  Reports   Being  Constipated  As  Well

## 2013-10-21 NOTE — Progress Notes (Signed)
  Pt admitted to the unit. Pt is stable, alert and oriented per baseline. Oriented to room, staff, and call bell. Educated to call for any assistance. Bed in lowest position, call bell within reach- will continue to monitor. 
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
## 2013-10-21 NOTE — ED Notes (Signed)
Attempted to call report.  RN Bailey Mech transferring 2 patients and will return call.

## 2013-10-22 ENCOUNTER — Encounter (HOSPITAL_COMMUNITY): Payer: Self-pay | Admitting: General Practice

## 2013-10-22 ENCOUNTER — Encounter (HOSPITAL_COMMUNITY)
Admission: EM | Disposition: A | Discharge status: Home / Self Care | Payer: Self-pay | Source: Home / Self Care | Attending: Family Medicine

## 2013-10-22 DIAGNOSIS — D649 Anemia, unspecified: Secondary | ICD-10-CM

## 2013-10-22 DIAGNOSIS — K922 Gastrointestinal hemorrhage, unspecified: Secondary | ICD-10-CM

## 2013-10-22 HISTORY — PX: ESOPHAGOGASTRODUODENOSCOPY: SHX5428

## 2013-10-22 LAB — TYPE AND SCREEN
ABO/RH(D): O POS
Antibody Screen: NEGATIVE
Unit division: 0
Unit division: 0

## 2013-10-22 LAB — CBC
HEMATOCRIT: 27.2 % — AB (ref 36.0–46.0)
Hemoglobin: 8.8 g/dL — ABNORMAL LOW (ref 12.0–15.0)
MCH: 26.3 pg (ref 26.0–34.0)
MCHC: 32.4 g/dL (ref 30.0–36.0)
MCV: 81.4 fL (ref 78.0–100.0)
Platelets: 478 10*3/uL — ABNORMAL HIGH (ref 150–400)
RBC: 3.34 MIL/uL — AB (ref 3.87–5.11)
RDW: 14.9 % (ref 11.5–15.5)
WBC: 7.6 10*3/uL (ref 4.0–10.5)

## 2013-10-22 LAB — GLUCOSE, CAPILLARY
GLUCOSE-CAPILLARY: 114 mg/dL — AB (ref 70–99)
GLUCOSE-CAPILLARY: 92 mg/dL (ref 70–99)
GLUCOSE-CAPILLARY: 115 mg/dL — AB (ref 70–99)
GLUCOSE-CAPILLARY: 102 mg/dL — AB (ref 70–99)
GLUCOSE-CAPILLARY: 83 mg/dL (ref 70–99)
Glucose-Capillary: 136 mg/dL — ABNORMAL HIGH (ref 70–99)
Glucose-Capillary: 69 mg/dL — ABNORMAL LOW (ref 70–99)
Glucose-Capillary: 91 mg/dL (ref 70–99)

## 2013-10-22 LAB — COMPREHENSIVE METABOLIC PANEL
ALBUMIN: 3.5 g/dL (ref 3.5–5.2)
ALK PHOS: 111 U/L (ref 39–117)
ALT: 18 U/L (ref 0–35)
AST: 17 U/L (ref 0–37)
BUN: 8 mg/dL (ref 6–23)
CO2: 25 mEq/L (ref 19–32)
Calcium: 9.3 mg/dL (ref 8.4–10.5)
Chloride: 106 mEq/L (ref 96–112)
Creatinine, Ser: 0.64 mg/dL (ref 0.50–1.10)
GFR calc non Af Amer: >90 mL/min (ref >90)
Glucose, Bld: 95 mg/dL (ref 70–99)
POTASSIUM: 4 meq/L (ref 3.7–5.3)
Sodium: 143 mEq/L (ref 137–147)
TOTAL PROTEIN: 7.1 g/dL (ref 6.0–8.3)
Total Bilirubin: <0.2 mg/dL — ABNORMAL LOW (ref 0.3–1.2)

## 2013-10-22 SURGERY — EGD (ESOPHAGOGASTRODUODENOSCOPY)
Anesthesia: Moderate Sedation

## 2013-10-22 MED ORDER — FENTANYL CITRATE 0.05 MG/ML IJ SOLN
INTRAMUSCULAR | Status: AC
  Filled 2013-10-22: qty 2

## 2013-10-22 MED ORDER — PEG-KCL-NACL-NASULF-NA ASC-C 100 G PO SOLR
1.0000 | Freq: Once | ORAL | Status: AC
  Administered 2013-10-22: 200 g via ORAL
  Filled 2013-10-22: qty 1

## 2013-10-22 MED ORDER — SODIUM CHLORIDE 0.9 % IV SOLN
INTRAVENOUS | Status: DC
  Administered 2013-10-22: 500 mL via INTRAVENOUS

## 2013-10-22 MED ORDER — INSULIN ASPART 100 UNIT/ML ~~LOC~~ SOLN: 0.0000 [IU] | Freq: Three times a day (TID) | SUBCUTANEOUS | Status: DC

## 2013-10-22 MED ORDER — SODIUM CHLORIDE 0.9 % IV SOLN
INTRAVENOUS | Status: DC
  Administered 2013-10-23 (×2): via INTRAVENOUS

## 2013-10-22 MED ORDER — MIDAZOLAM HCL 5 MG/ML IJ SOLN
INTRAMUSCULAR | Status: AC
  Filled 2013-10-22: qty 2

## 2013-10-22 MED ORDER — PNEUMOCOCCAL VAC POLYVALENT 25 MCG/0.5ML IJ INJ
0.5000 mL | INJECTION | INTRAMUSCULAR | Status: AC
  Administered 2013-10-23: 0.5 mL via INTRAMUSCULAR
  Filled 2013-10-22: qty 0.5

## 2013-10-22 MED ORDER — FENTANYL CITRATE 0.05 MG/ML IJ SOLN
INTRAMUSCULAR | Status: DC | PRN
  Administered 2013-10-22 (×2): 25 ug via INTRAVENOUS

## 2013-10-22 MED ORDER — NICOTINE 21 MG/24HR TD PT24
21.0000 mg | MEDICATED_PATCH | Freq: Every day | TRANSDERMAL | Status: DC
  Administered 2013-10-22 – 2013-10-23 (×2): 21 mg via TRANSDERMAL
  Filled 2013-10-22 (×3): qty 1

## 2013-10-22 MED ORDER — BUTAMBEN-TETRACAINE-BENZOCAINE 2-2-14 % EX AERO
INHALATION_SPRAY | CUTANEOUS | Status: DC | PRN
  Administered 2013-10-22: 2 via TOPICAL

## 2013-10-22 MED ORDER — MIDAZOLAM HCL 10 MG/2ML IJ SOLN
INTRAMUSCULAR | Status: DC | PRN
  Administered 2013-10-22: 2 mg via INTRAVENOUS
  Administered 2013-10-22: 1 mg via INTRAVENOUS

## 2013-10-22 NOTE — Op Note (Signed)
[facility] [place], [id number]   ENDOSCOPY PROCEDURE REPORT  PATIENT: Jacqueline Dean, Jacqueline Dean  MR#: [id number] BIRTHDATE: [birth year] , 61  yrs. old GENDER: Female ENDOSCOPIST: Milus Banister, MD REFERRED BY:  [facility] PROCEDURE DATE:  10/22/2013 PROCEDURE:  EGD, diagnostic ASA CLASS:     Class II INDICATIONS:  black stools, anemia. MEDICATIONS: Fentanyl 50 mcg IV and Versed 3 mg IV TOPICAL ANESTHETIC: Cetacaine Spray  DESCRIPTION OF PROCEDURE: After the risks benefits and alternatives of the procedure were thoroughly explained, informed consent was obtained.  The PENTAX GASTOROSCOPE [id number] endoscope was introduced through the mouth and advanced to the second portion of the duodenum. Without limitations.  The instrument was slowly withdrawn as the mucosa was fully examined.   The upper, middle and distal third of the esophagus were carefully inspected and no abnormalities were noted.  The z-line was well seen at the GEJ.  The endoscope was pushed into the fundus which was normal including a retroflexed view.  The antrum, gastric body, first and second part of the duodenum were unremarkable. Retroflexed views revealed no abnormalities.     The scope was then withdrawn from the patient and the procedure completed. COMPLICATIONS: There were no complications.  ENDOSCOPIC IMPRESSION: Normal EGD. These findings do not explain her black stools, anemia.  RECOMMENDATIONS: Will proceed with colonoscopy tomorrow.   eSigned:  Milus Banister, MD 10/22/2013 8:23 AM

## 2013-10-22 NOTE — Progress Notes (Signed)
Hypoglycemic Event  CBG: 69  Treatment: 15 GM carbohydrate snack  Symptoms: None  Follow-up CBG: Time:2146 CBG Result:83  Possible Reasons for Event: Inadequate meal intake  Comments/MD notified:NP, Schorr    Jacqueline Dean  Remember to initiate Hypoglycemia Order Set & complete

## 2013-10-22 NOTE — H&P (Addendum)
Note: This document was prepared with digital dictation and possible smart phrase technology. Any transcriptional errors that result from this process are unintentional.   Jacqueline Dean ULA:[id number] DOB: 10/10/1950 DOA: 10/21/2013 PCP: [name], MD  Brief narrative: 63 y/o ?, known history brain aneurysm 2000 status post coiling, hypertension, diabetes mellitus non-insulin-dependent, partial hyst in 1990, and no other significant medical history is came to urgent care Center today with one to 2 weeks of malaise Heme occult positive/melanotic stool, hemoglobin 7.8 Transferred to inpatient as acute GI bleed   Past medical history-As per Problem list Chart reviewed as below- Review  Consultants:  Gastroenterology  Procedures:  Upper endoscopy 5/17 no findings  Antibiotics:  None   Subjective  Doing fair. No dark stools now Tolerating clears No abdominal pain no nausea   Objective    Interim History: None  Telemetry: Sinus   Objective: Filed Vitals:   10/22/13 0824 10/22/13 0830 10/22/13 0840 10/22/13 0849  BP: 126/51 129/46 126/53 138/53  Pulse: 84 81 79 76  Temp: 98.7 F (37.1 C)     TempSrc: Oral     Resp: _0 Height:      Weight:      SpO2: 100% 100% 100% 100%    Intake/Output Summary (Last 24 hours) at 10/22/13 1223 Last data filed at 10/22/13 2122  Gross per 24 hour  Intake 1117.92 ml  Output      0 ml  Net 1117.92 ml    Exam:  General: Alert pleasant oriented Cardiovascular: S1-S2 no murmur gallop Respiratory: Clinically clear Abdomen: Soft nontender no rebound or guarding Skin no lower extremity edema Neuro intact  Data Reviewed: Basic Metabolic Panel:  Recent Labs Lab 10/21/13 1412 10/21/13 1526 10/22/13 0359  NA 138 136* 143  K 3.9 3.9 4.0  CL 104 99 106  CO2  --  24 25  GLUCOSE 101* 112* 95  BUN _1 CREATININE 0.80 0.69 0.64  CALCIUM  --  9.4 9.3   Liver Function Tests:  Recent Labs Lab  10/21/13 1526 10/22/13 0359  AST 18 17  ALT 21 18  ALKPHOS 116 111  BILITOT <0.2* <0.2*  PROT 8.0 7.1  ALBUMIN 4.0 3.5   No results found for this basename: LIPASE, AMYLASE,  in the last 168 hours No results found for this basename: AMMONIA,  in the last 168 hours CBC:  Recent Labs Lab 10/21/13 1412 10/21/13 1526 10/22/13 0359  WBC  --  8.1 7.6  HGB 7.8* 6.8* 8.8*  HCT 23.0* 21.8* 27.2*  MCV  --  80.7 81.4  PLT  --  592* 478*   Cardiac Enzymes: No results found for this basename: CKTOTAL, CKMB, CKMBINDEX, TROPONINI,  in the last 168 hours BNP: No components found with this basename: POCBNP,  CBG:  Recent Labs Lab 10/21/13 2103 10/22/13 0038 10/22/13 0430 10/22/13 0757 10/22/13 1203  GLUCAP 114* 136* 92 115* 102*    No results found for this or any previous visit (from the past 240 hour(s)).   Studies:              All Imaging reviewed and is as per above notation   Scheduled Meds: . busPIRone  15 mg Oral TID  . diltiazem  240 mg Oral Daily  . insulin aspart  0-9 Units Subcutaneous 6 times per day  . peg 3350 powder  1 kit Oral Once  . phenytoin  100 mg Oral Daily  . phenytoin  400 mg Oral QHS  . [START ON 10/23/2013] pneumococcal 23 valent vaccine  0.5 mL Intramuscular Tomorrow-1000  . sodium chloride  3 mL Intravenous Q12H   Continuous Infusions:    Assessment/Plan: 1. GI bleed-likely lower GI source. 4 colonoscopy in a.m.. 2. Acute blood loss anemia-secondary to #1. Transfuse 2 units 5/16. Posttransfusion hematocrit stable. Repeat blood counts every morning 3. History aneurysm and coiling 2000-stable currently. 4. Non-insulin-dependent diabetes mellitus-transition from every 4-4 times a day coverage without basal insulin for now as poor by mouth intake. Blood sugars 90-136. 5. Anxiety-continue buspar 10 tid 6. Htn-not hypotensive and actually hypertensive so re-start diltiazem 240 daily.  Code Status: Full Family Communication: daughters + and  aware Disposition Plan: inpatient   Verneita Griffes, MD  [facility] Pager [phone] 10/22/2013, 12:23 PM    LOS: 1 day

## 2013-10-22 NOTE — Interval H&P Note (Signed)
History and Physical Interval Note:  10/22/2013 7:46 AM  Jacqueline Dean  has presented today for surgery, with the diagnosis of gi bleed  The various methods of treatment have been discussed with the patient and family. After consideration of risks, benefits and other options for treatment, the patient has consented to  Procedure(s): ESOPHAGOGASTRODUODENOSCOPY (EGD) (N/A) as a surgical intervention .  The patient's history has been reviewed, patient examined, no change in status, stable for surgery.  I have reviewed the patient's chart and labs.  Questions were answered to the patient's satisfaction.     Milus Banister

## 2013-10-22 NOTE — Progress Notes (Signed)
Patient had stool and called RN into room to see the content of it. Toilet was red but it seemed to be more light red then dark, like blood. Patient admitted to eating red jello and red icy this afternoon. Charge nurse took a second look at stool and agreed that it did not look like blood. Will continue to monitor

## 2013-10-22 NOTE — ED Provider Notes (Signed)
CSN: [id number]     Arrival date & time 10/21/13  1507 History   First MD Initiated Contact with Patient 10/21/13 1613     Chief Complaint  Patient presents with  . GI Bleeding     (Consider location/radiation/quality/duration/timing/severity/associated sxs/prior Treatment) The history is provided by the patient.   patient presents with GI bleed. She's had fatigue. She was sent down from urgent care. She's had some red and black stool since being started on aspirin 3 weeks ago. She's had increasing fatigue. Minimal abdominal pain. Past Medical History  Diagnosis Date  . Diabetes mellitus without complication   . Hypertension   . Brain aneurysm 2000   Past Surgical History  Procedure Laterality Date  . Abdominal hysterectomy      partial  . Brain surgery  2000  . Goiter    . Ventriculoperitoneal shunt  2001   Family History  Problem Relation Age of Onset  . Hypertension Mother   . Diabetes Mother   . Heart disease Mother   . Hypertension Sister   . Stroke Mother     Bed at age 41  . Heart attack Father     Died at age 36 with this   History  Substance Use Topics  . Smoking status: Current Every Day Smoker    Types: Cigarettes  . Smokeless tobacco: Not on file  . Alcohol Use: No   OB History   Grav Para Term Preterm Abortions TAB SAB Ect Mult Living                 Review of Systems  Constitutional: Positive for fatigue. Negative for activity change and appetite change.  Eyes: Negative for pain.  Respiratory: Negative for chest tightness and shortness of breath.   Cardiovascular: Negative for chest pain and leg swelling.  Gastrointestinal: Positive for blood in stool. Negative for nausea, vomiting, abdominal pain and diarrhea.  Genitourinary: Negative for flank pain.  Musculoskeletal: Negative for back pain and neck stiffness.  Skin: Negative for rash.  Neurological: Positive for light-headedness. Negative for weakness, numbness and headaches.   Psychiatric/Behavioral: Negative for behavioral problems.      Allergies  Review of patient's allergies indicates no known allergies.  Home Medications   Prior to Admission medications   Medication Sig Start Date End Date Taking? Authorizing Provider  aspirin 81 MG tablet Take 81 mg by mouth daily.   Yes Historical Provider, MD  busPIRone (BUSPAR) 15 MG tablet Take 15 mg by mouth 3 (three) times daily.   Yes Historical Provider, MD  diltiazem (CARDIZEM CD) 240 MG 24 hr capsule Take 240 mg by mouth daily.   Yes Historical Provider, MD  losartan-hydrochlorothiazide (HYZAAR) 100-12.5 MG per tablet Take 1 tablet by mouth daily.   Yes Historical Provider, MD  Multiple Vitamin (MULTIVITAMIN WITH MINERALS) TABS tablet Take 1 tablet by mouth daily.   Yes Historical Provider, MD  phenytoin (DILANTIN) 100 MG ER capsule Take 100-400 mg by mouth 2 (two) times daily. 100mg  in am, and 400mg  at bedtime   Yes Historical Provider, MD   BP 113/73  Pulse 81  Temp(Src) 98.3 F (36.8 C) (Oral)  Resp 20  Ht 5\' 2"  (1.575 m)  Wt 166 lb 7.2 oz (75.5 kg)  BMI 30.44 kg/m2  SpO2 96% Physical Exam  Nursing note and vitals reviewed. Constitutional: She is oriented to person, place, and time. She appears well-developed and well-nourished.  HENT:  Head: Normocephalic and atraumatic.  Eyes: EOM are normal. Pupils  are equal, round, and reactive to light.  Neck: Normal range of motion. Neck supple.  Cardiovascular: Normal rate, regular rhythm and normal heart sounds.   No murmur heard. Pulmonary/Chest: Effort normal and breath sounds normal. No respiratory distress. She has no wheezes. She has no rales.  Abdominal: Soft. Bowel sounds are normal. She exhibits no distension. There is no tenderness. There is no rebound and no guarding.  Musculoskeletal: Normal range of motion.  Neurological: She is alert and oriented to person, place, and time. No cranial nerve deficit.  Skin: Skin is warm and dry.   Psychiatric: She has a normal mood and affect. Her speech is normal.    ED Course  Procedures (including critical care time) Labs Review Labs Reviewed  CBC - Abnormal; Notable for the following:    RBC 2.70 (*)    Hemoglobin 6.8 (*)    HCT 21.8 (*)    MCH 25.2 (*)    Platelets 592 (*)    All other components within normal limits  COMPREHENSIVE METABOLIC PANEL - Abnormal; Notable for the following:    Sodium 136 (*)    Glucose, Bld 112 (*)    Total Bilirubin <0.2 (*)    All other components within normal limits  PROTIME-INR  APTT  PHENYTOIN LEVEL, TOTAL  COMPREHENSIVE METABOLIC PANEL  CBC  TYPE AND SCREEN  ABO/RH  PREPARE RBC (CROSSMATCH)    Imaging Review No results found.   EKG Interpretation None      MDM   Final diagnoses:  GI bleed  Anemia     patient presents with GI bleed. Seen in urgent care and sent here. Has anemia. Has melanotic stool. May be related to aspirin. Will admit to internal medicine with GI consult    Jasper Riling. Alvino Chapel, MD 10/22/13 0315

## 2013-10-22 NOTE — Progress Notes (Signed)
2 units given, patient denied any pain, chills, or n/v. No fevers persisted, will continue to monitor

## 2013-10-22 NOTE — Consult Note (Signed)
[facility] Referring Provider: [facility] Primary Care Physician:  [name], MD Primary Gastroenterologist:  None  Reason for Consultation: GI bleed   HPI:  Jacqueline Dean is a 63 y.o. female who noted dark stools for the past week. Black, but not loose or more frequent than usual.  +mild abd pains that are usually improved with eating, epigastric.  No nausea or vomiting.  No GERD symptoms. She takes no NSAIDs and only started 81mg  ASA 2 weeks ago.  No weight loss.  Has never had colonoscopy or upper endoscopy.  Became fatigued and went to urgent clinic, found to be anemic and sent to ER.    No CP, no SOB, no dizziness.    Hb was 6.8, received 2 units blood and Hb 8.8 this AM. Was started on IV PPI gtts.   Past Medical History  Diagnosis Date  . Diabetes mellitus without complication   . Hypertension   . Brain aneurysm 2000    Past Surgical History  Procedure Laterality Date  . Abdominal hysterectomy      partial  . Brain surgery  2000  . Goiter    . Ventriculoperitoneal shunt  2001    Prior to Admission medications   Medication Sig Start Date End Date Taking? Authorizing Provider  aspirin 81 MG tablet Take 81 mg by mouth daily.   Yes Historical Provider, MD  busPIRone (BUSPAR) 15 MG tablet Take 15 mg by mouth 3 (three) times daily.   Yes Historical Provider, MD  diltiazem (CARDIZEM CD) 240 MG 24 hr capsule Take 240 mg by mouth daily.   Yes Historical Provider, MD  losartan-hydrochlorothiazide (HYZAAR) 100-12.5 MG per tablet Take 1 tablet by mouth daily.   Yes Historical Provider, MD  Multiple Vitamin (MULTIVITAMIN WITH MINERALS) TABS tablet Take 1 tablet by mouth daily.   Yes Historical Provider, MD  phenytoin (DILANTIN) 100 MG ER capsule Take 100-400 mg by mouth 2 (two) times daily. 100mg  in am, and 400mg  at bedtime   Yes Historical Provider, MD    Current Facility-Administered Medications  Medication Dose Route Frequency Provider Last Rate  Last Dose  . busPIRone (BUSPAR) tablet 15 mg  15 mg Oral TID [name], MD   15 mg at 10/21/13 2157  . diltiazem (CARDIZEM CD) 24 hr capsule 240 mg  240 mg Oral Daily [name], MD      . insulin aspart (novoLOG) injection 0-9 Units  0-9 Units Subcutaneous 6 times per day [name], MD   1 Units at 10/22/13 0046  . pantoprazole (PROTONIX) 80 mg in sodium chloride 0.9 % 250 mL infusion  8 mg/hr Intravenous Continuous [name], MD 25 mL/hr at 10/22/13 0519 8 mg/hr at 10/22/13 0519  . phenytoin (DILANTIN) ER capsule 100 mg  100 mg Oral Daily Rande Lawman Rumbarger, RPH      . phenytoin (DILANTIN) ER capsule 400 mg  400 mg Oral QHS Rande Lawman Rumbarger, RPH   400 mg at 10/21/13 2158  . [START ON 10/23/2013] pneumococcal 23 valent vaccine (PNU-IMMUNE) injection 0.5 mL  0.5 mL Intramuscular Tomorrow-1000 Jai-Gurmukh Samtani, MD      . sodium chloride 0.9 % injection 3 mL  3 mL Intravenous Q12H [name], MD        Allergies as of 10/21/2013  . (No Known Allergies)    Family History  Problem Relation Age of Onset  . Hypertension Mother   . Diabetes Mother   . Heart disease Mother   . Hypertension Sister   .  Stroke Mother     Bed at age 10  . Heart attack Father     Died at age 48 with this    History   Social History  . Marital Status: Widowed    Spouse Name: N/A    Number of Children: N/A  . Years of Education: N/A   Occupational History  . Not on file.   Social History Main Topics  . Smoking status: Current Every Day Smoker    Types: Cigarettes  . Smokeless tobacco: Not on file  . Alcohol Use: No  . Drug Use: No  . Sexual Activity: Not on file   Other Topics Concern  . Not on file   Social History Narrative   Currently disabled since may of 2000 when she had her aneurysm surgery in [place] coiling of the ureter. 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 She smokes pot half pack per day but has been smoking less  recently   She drinks occasionally   She does not do drugs      Gynecological history G0 P0 secondary to fibroidectomy 1990     Review of Systems: Pertinent positive and negative review of systems were noted in the above HPI section. Complete review of systems was performed and was otherwise normal.   Physical Exam: Vital signs in last 24 hours: Temp:  [97.4 F (36.3 C)-99.3 F (37.4 C)] 98.1 F (36.7 C) (05/17 0118) Pulse Rate:  [77-97] 77 (05/17 0118) Resp:  [16-22] 18 (05/17 0032) BP: (108-164)/(52-86) 115/74 mmHg (05/17 0118) SpO2:  [96 %-100 %] 98 % (05/17 0118) Weight:  [165 lb (74.844 kg)-166 lb 7.2 oz (75.5 kg)] 166 lb 7.2 oz (75.5 kg) (05/16 1911) Last BM Date: 10/21/13 Constitutional: generally well-appearing Psychiatric: alert and oriented x3 Eyes: extraocular movements intact Mouth: oral pharynx moist, no lesions Neck: supple no lymphadenopathy Cardiovascular: heart regular rate and rhythm Lungs: clear to auscultation bilaterally Abdomen: soft, nontender, nondistended, no obvious ascites, no peritoneal signs, normal bowel sounds Extremities: no lower extremity edema bilaterally Skin: no lesions on visible extremities   Lab Results:  Recent Labs  10/21/13 1412 10/21/13 1526 10/22/13 0359  WBC  --  8.1 7.6  HGB 7.8* 6.8* 8.8*  HCT 23.0* 21.8* 27.2*  PLT  --  592* 478*  MCV  --  80.7 81.4   BMET  Recent Labs  10/21/13 1412 10/21/13 1526 10/22/13 0359  NA 138 136* 143  K 3.9 3.9 4.0  CL 104 99 106  CO2  --  24 25  GLUCOSE 101* 112* 95  BUN 7 9 8   CREATININE 0.80 0.69 0.64  CALCIUM  --  9.4 9.3   LFT  Recent Labs  10/22/13 0359  BILITOT <0.2*  AST 17  ALT 18  ALKPHOS 111  PROT 7.1  ALBUMIN 3.5   PT/INR  Recent Labs  10/21/13 1654  LABPROT 13.4  INR 1.04     Impression/Plan: 63 y.o. female with GI bleed  Melena usually is usually from UGI bleeding however her BUN is normal and so this may be lower source.  Will start  workup with EGD this AM.  If no clear etiology then she will need colonoscopy tomorrow.     Milus Banister, MD  10/22/2013, 6:50 AM [facility] Pager [phone]

## 2013-10-22 NOTE — H&P (View-Only) (Signed)
[facility] Referring Provider: [facility] Primary Care Physician:  [name], MD Primary Gastroenterologist:  None  Reason for Consultation: GI bleed   HPI:  Jacqueline Dean is a 63 y.o. female who noted dark stools for the past week. Black, but not loose or more frequent than usual.  +mild abd pains that are usually improved with eating, epigastric.  No nausea or vomiting.  No GERD symptoms. She takes no NSAIDs and only started 81mg  ASA 2 weeks ago.  No weight loss.  Has never had colonoscopy or upper endoscopy.  Became fatigued and went to urgent clinic, found to be anemic and sent to ER.    No CP, no SOB, no dizziness.    Hb was 6.8, received 2 units blood and Hb 8.8 this AM. Was started on IV PPI gtts.   Past Medical History  Diagnosis Date  . Diabetes mellitus without complication   . Hypertension   . Brain aneurysm 2000    Past Surgical History  Procedure Laterality Date  . Abdominal hysterectomy      partial  . Brain surgery  2000  . Goiter    . Ventriculoperitoneal shunt  2001    Prior to Admission medications   Medication Sig Start Date End Date Taking? Authorizing Provider  aspirin 81 MG tablet Take 81 mg by mouth daily.   Yes Historical Provider, MD  busPIRone (BUSPAR) 15 MG tablet Take 15 mg by mouth 3 (three) times daily.   Yes Historical Provider, MD  diltiazem (CARDIZEM CD) 240 MG 24 hr capsule Take 240 mg by mouth daily.   Yes Historical Provider, MD  losartan-hydrochlorothiazide (HYZAAR) 100-12.5 MG per tablet Take 1 tablet by mouth daily.   Yes Historical Provider, MD  Multiple Vitamin (MULTIVITAMIN WITH MINERALS) TABS tablet Take 1 tablet by mouth daily.   Yes Historical Provider, MD  phenytoin (DILANTIN) 100 MG ER capsule Take 100-400 mg by mouth 2 (two) times daily. 100mg  in am, and 400mg  at bedtime   Yes Historical Provider, MD    Current Facility-Administered Medications  Medication Dose Route Frequency Provider Last Rate  Last Dose  . busPIRone (BUSPAR) tablet 15 mg  15 mg Oral TID [name], MD   15 mg at 10/21/13 2157  . diltiazem (CARDIZEM CD) 24 hr capsule 240 mg  240 mg Oral Daily [name], MD      . insulin aspart (novoLOG) injection 0-9 Units  0-9 Units Subcutaneous 6 times per day [name], MD   1 Units at 10/22/13 0046  . pantoprazole (PROTONIX) 80 mg in sodium chloride 0.9 % 250 mL infusion  8 mg/hr Intravenous Continuous [name], MD 25 mL/hr at 10/22/13 0519 8 mg/hr at 10/22/13 0519  . phenytoin (DILANTIN) ER capsule 100 mg  100 mg Oral Daily Rande Lawman Rumbarger, RPH      . phenytoin (DILANTIN) ER capsule 400 mg  400 mg Oral QHS Rande Lawman Rumbarger, RPH   400 mg at 10/21/13 2158  . [START ON 10/23/2013] pneumococcal 23 valent vaccine (PNU-IMMUNE) injection 0.5 mL  0.5 mL Intramuscular Tomorrow-1000 Jai-Gurmukh Samtani, MD      . sodium chloride 0.9 % injection 3 mL  3 mL Intravenous Q12H [name], MD        Allergies as of 10/21/2013  . (No Known Allergies)    Family History  Problem Relation Age of Onset  . Hypertension Mother   . Diabetes Mother   . Heart disease Mother   . Hypertension Sister   .  Stroke Mother     Bed at age 10  . Heart attack Father     Died at age 48 with this    History   Social History  . Marital Status: Widowed    Spouse Name: N/A    Number of Children: N/A  . Years of Education: N/A   Occupational History  . Not on file.   Social History Main Topics  . Smoking status: Current Every Day Smoker    Types: Cigarettes  . Smokeless tobacco: Not on file  . Alcohol Use: No  . Drug Use: No  . Sexual Activity: Not on file   Other Topics Concern  . Not on file   Social History Narrative   Currently disabled since may of 2000 when she had her aneurysm surgery in [place] coiling of the ureter. 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 She smokes pot half pack per day but has been smoking less  recently   She drinks occasionally   She does not do drugs      Gynecological history G0 P0 secondary to fibroidectomy 1990     Review of Systems: Pertinent positive and negative review of systems were noted in the above HPI section. Complete review of systems was performed and was otherwise normal.   Physical Exam: Vital signs in last 24 hours: Temp:  [97.4 F (36.3 C)-99.3 F (37.4 C)] 98.1 F (36.7 C) (05/17 0118) Pulse Rate:  [77-97] 77 (05/17 0118) Resp:  [16-22] 18 (05/17 0032) BP: (108-164)/(52-86) 115/74 mmHg (05/17 0118) SpO2:  [96 %-100 %] 98 % (05/17 0118) Weight:  [165 lb (74.844 kg)-166 lb 7.2 oz (75.5 kg)] 166 lb 7.2 oz (75.5 kg) (05/16 1911) Last BM Date: 10/21/13 Constitutional: generally well-appearing Psychiatric: alert and oriented x3 Eyes: extraocular movements intact Mouth: oral pharynx moist, no lesions Neck: supple no lymphadenopathy Cardiovascular: heart regular rate and rhythm Lungs: clear to auscultation bilaterally Abdomen: soft, nontender, nondistended, no obvious ascites, no peritoneal signs, normal bowel sounds Extremities: no lower extremity edema bilaterally Skin: no lesions on visible extremities   Lab Results:  Recent Labs  10/21/13 1412 10/21/13 1526 10/22/13 0359  WBC  --  8.1 7.6  HGB 7.8* 6.8* 8.8*  HCT 23.0* 21.8* 27.2*  PLT  --  592* 478*  MCV  --  80.7 81.4   BMET  Recent Labs  10/21/13 1412 10/21/13 1526 10/22/13 0359  NA 138 136* 143  K 3.9 3.9 4.0  CL 104 99 106  CO2  --  24 25  GLUCOSE 101* 112* 95  BUN 7 9 8   CREATININE 0.80 0.69 0.64  CALCIUM  --  9.4 9.3   LFT  Recent Labs  10/22/13 0359  BILITOT <0.2*  AST 17  ALT 18  ALKPHOS 111  PROT 7.1  ALBUMIN 3.5   PT/INR  Recent Labs  10/21/13 1654  LABPROT 13.4  INR 1.04     Impression/Plan: 63 y.o. female with GI bleed  Melena usually is usually from UGI bleeding however her BUN is normal and so this may be lower source.  Will start  workup with EGD this AM.  If no clear etiology then she will need colonoscopy tomorrow.     Milus Banister, MD  10/22/2013, 6:50 AM [facility] Pager [phone]

## 2013-10-23 ENCOUNTER — Inpatient Hospital Stay (HOSPITAL_COMMUNITY): Payer: PRIVATE HEALTH INSURANCE | Admitting: Anesthesiology

## 2013-10-23 ENCOUNTER — Encounter (HOSPITAL_COMMUNITY)
Admission: EM | Disposition: A | Discharge status: Home / Self Care | Payer: Self-pay | Source: Home / Self Care | Attending: Family Medicine

## 2013-10-23 ENCOUNTER — Encounter (HOSPITAL_COMMUNITY): Payer: Self-pay | Admitting: Gastroenterology

## 2013-10-23 ENCOUNTER — Encounter (HOSPITAL_COMMUNITY): Payer: PRIVATE HEALTH INSURANCE | Admitting: Anesthesiology

## 2013-10-23 DIAGNOSIS — D126 Benign neoplasm of colon, unspecified: Secondary | ICD-10-CM

## 2013-10-23 HISTORY — PX: COLONOSCOPY WITH PROPOFOL: SHX5780

## 2013-10-23 LAB — CBC
HCT: 27.3 % — ABNORMAL LOW (ref 36.0–46.0)
Hemoglobin: 8.9 g/dL — ABNORMAL LOW (ref 12.0–15.0)
MCH: 26.9 pg (ref 26.0–34.0)
MCHC: 32.6 g/dL (ref 30.0–36.0)
MCV: 82.5 fL (ref 78.0–100.0)
Platelets: 395 10*3/uL (ref 150–400)
RBC: 3.31 MIL/uL — AB (ref 3.87–5.11)
RDW: 15.3 % (ref 11.5–15.5)
WBC: 5.3 10*3/uL (ref 4.0–10.5)

## 2013-10-23 LAB — GLUCOSE, CAPILLARY
GLUCOSE-CAPILLARY: 104 mg/dL — AB (ref 70–99)
GLUCOSE-CAPILLARY: 107 mg/dL — AB (ref 70–99)
Glucose-Capillary: 85 mg/dL (ref 70–99)
Glucose-Capillary: 82 mg/dL (ref 70–99)
Glucose-Capillary: 65 mg/dL — ABNORMAL LOW (ref 70–99)
Glucose-Capillary: 123 mg/dL — ABNORMAL HIGH (ref 70–99)

## 2013-10-23 SURGERY — COLONOSCOPY WITH PROPOFOL
Anesthesia: Monitor Anesthesia Care

## 2013-10-23 MED ORDER — METOCLOPRAMIDE HCL 5 MG/ML IJ SOLN
10.0000 mg | Freq: Once | INTRAMUSCULAR | Status: AC | PRN
  Filled 2013-10-23: qty 2

## 2013-10-23 MED ORDER — LACTATED RINGERS IV SOLN
INTRAVENOUS | Status: DC
  Administered 2013-10-23: 1000 mL via INTRAVENOUS

## 2013-10-23 MED ORDER — LACTATED RINGERS IV SOLN
INTRAVENOUS | Status: DC | PRN
  Administered 2013-10-23: 11:00:00 via INTRAVENOUS

## 2013-10-23 MED ORDER — FENTANYL CITRATE 0.05 MG/ML IJ SOLN: 25.0000 ug | INTRAMUSCULAR | Status: DC | PRN

## 2013-10-23 MED ORDER — LIDOCAINE HCL (CARDIAC) 20 MG/ML IV SOLN
INTRAVENOUS | Status: DC | PRN
  Administered 2013-10-23: 80 mg via INTRAVENOUS
  Administered 2013-10-23: 20 mg via INTRAVENOUS

## 2013-10-23 MED ORDER — PROPOFOL 10 MG/ML IV BOLUS
INTRAVENOUS | Status: DC | PRN
  Administered 2013-10-23: 20 mg via INTRAVENOUS
  Administered 2013-10-23: 50 mg via INTRAVENOUS
  Administered 2013-10-23 (×2): 30 mg via INTRAVENOUS
  Administered 2013-10-23: 20 mg via INTRAVENOUS
  Administered 2013-10-23 (×4): 30 mg via INTRAVENOUS

## 2013-10-23 MED ORDER — MEPERIDINE HCL 25 MG/ML IJ SOLN: 6.2500 mg | INTRAMUSCULAR | Status: DC | PRN

## 2013-10-23 NOTE — Anesthesia Preprocedure Evaluation (Addendum)
Anesthesia Evaluation  Patient identified by MRN, date of birth, ID band Patient awake    Reviewed: Allergy & Precautions, H&P , NPO status , Patient's Chart, lab work & pertinent test results  Airway Mallampati: II TM Distance: >3 FB Neck ROM: Full    Dental  (+) Upper Dentures   Pulmonary Current Smoker,  breath sounds clear to auscultation  Pulmonary exam normal       Cardiovascular hypertension, Pt. on medications + Peripheral Vascular Disease Rhythm:Regular Rate:Bradycardia  Hx/o cerebral anuerysm   Neuro/Psych PSYCHIATRIC DISORDERS negative neurological ROS     GI/Hepatic Neg liver ROS, Melena   Endo/Other  diabetes, Well Controlled, Type 2  Renal/GU negative Renal ROS  negative genitourinary   Musculoskeletal negative musculoskeletal ROS (+)   Abdominal Normal abdominal exam  (+)   Peds  Hematology  (+) anemia ,   Anesthesia Other Findings   Reproductive/Obstetrics negative OB ROS                          Anesthesia Physical Anesthesia Plan  ASA: II  Anesthesia Plan: MAC   Post-op Pain Management:    Induction: Intravenous  Airway Management Planned: Nasal Cannula  Additional Equipment:   Intra-op Plan:   Post-operative Plan:   Informed Consent: I have reviewed the patients History and Physical, chart, labs and discussed the procedure including the risks, benefits and alternatives for the proposed anesthesia with the patient or authorized representative who has indicated his/her understanding and acceptance.   Dental advisory given  Plan Discussed with: Anesthesiologist, CRNA and Surgeon  Anesthesia Plan Comments:         Anesthesia Quick Evaluation

## 2013-10-23 NOTE — Interval H&P Note (Signed)
History and Physical Interval Note:  10/23/2013 10:24 AM  Jacqueline Dean  has presented today for surgery, with the diagnosis of gi bleed  The various methods of treatment have been discussed with the patient and family. After consideration of risks, benefits and other options for treatment, the patient has consented to  Procedure(s): COLONOSCOPY WITH PROPOFOL (N/A) as a surgical intervention .  The patient's history has been reviewed, patient examined, no change in status, stable for surgery.  I have reviewed the patient's chart and labs.  Questions were answered to the patient's satisfaction.     Ladene Artist MD

## 2013-10-23 NOTE — H&P (View-Only) (Signed)
          Daily Rounding Note  10/23/2013, 8:51 AM  LOS: 2 days   SUBJECTIVE:       Some red stool, than darker blood last night with prep.  Did have some red dye containing jello and icy in afternoon.  Stools now clear  OBJECTIVE:         Vital signs in last 24 hours:    Temp:  [98.3 F (36.8 C)-98.7 F (37.1 C)] 98.3 F (36.8 C) (05/18 0509) Pulse Rate:  [66-76] 71 (05/18 0509) Resp:  [18] 18 (05/18 0509) BP: (114-136)/(72-76) 129/76 mmHg (05/18 0509) SpO2:  [99 %-100 %] 100 % (05/18 0509) Last BM Date: 10/23/13 General: looks well, NAD.    Did not reexamine.   Intake/Output from previous day: 05/17 0701 - 05/18 0700 In: 432.9 [I.V.:432.9] Out: -   Intake/Output this shift:    Lab Results:  Recent Labs  10/21/13 1526 10/22/13 0359 10/23/13 0815  WBC 8.1 7.6 5.3  HGB 6.8* 8.8* 8.9*  HCT 21.8* 27.2* 27.3*  PLT 592* 478* 395  MCV   81  BMET  Recent Labs  10/21/13 1412 10/21/13 1526 10/22/13 0359  NA 138 136* 143  K 3.9 3.9 4.0  CL 104 99 106  CO2  --  24 25  GLUCOSE 101* 112* 95  BUN 7 9 8  CREATININE 0.80 0.69 0.64  CALCIUM  --  9.4 9.3   LFT  Recent Labs  10/21/13 1526 10/22/13 0359  PROT 8.0 7.1  ALBUMIN 4.0 3.5  AST 18 17  ALT 21 18  ALKPHOS 116 111  BILITOT <0.2* <0.2*   PT/INR  Recent Labs  10/21/13 1654  LABPROT 13.4  INR 1.04    ASSESMENT:   *   Normocytic anemia.  S/p 2 PRBCs.   *   Dark stools.  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   *  NIDDM, type 2.     PLAN   *  Colonoscopy today.    Sarah J Gribbin  10/23/2013, 8:51 AM Pager: [phone]     Attending physician's note   I have taken an interval history, reviewed the chart and examined the patient. I agree with the Advanced Practitioner's note, impression and recommendations.   Margarine Grosshans T. Khori Rosevear, MD FACG 

## 2013-10-23 NOTE — Op Note (Addendum)
[facility] [place], [address]   COLONOSCOPY PROCEDURE REPORT PATIENT: Jacqueline Dean, Jacqueline Dean  MR#: [id number] BIRTHDATE: [birth year] , 37  yrs. old GENDER: Female ENDOSCOPIST: Ladene Artist, MD, [facility] REFERRED ZY:[facility] PROCEDURE DATE:  10/23/2013 PROCEDURE:   Colonoscopy with snare polypectomy First Screening Colonoscopy - Avg.  risk and is 50 yrs.  old or older - No.  Prior Negative Screening - Now for repeat screening. N/A  History of Adenoma - Now for follow-up colonoscopy & has been > or = to 3 yrs.  N/A  Polyps Removed Today? Yes. ASA CLASS:   Class III INDICATIONS:melena.  EGD was negative. MEDICATIONS: MAC sedation, administered by CRNA and propofol (Diprivan) 270mg  IV DESCRIPTION OF PROCEDURE:   After the risks benefits and alternatives of the procedure were thoroughly explained, informed consent was obtained.  A digital rectal exam revealed no abnormalities of the rectum.   The Pentax Ped Colon [id number] endoscope was introduced through the anus and advanced to the terminal ileum which was intubated for a short distance. No adverse events experienced.   The quality of the prep was good, using MoviPrep  The instrument was then slowly withdrawn as the colon was fully examined.  COLON FINDINGS: Two sessile polyps measuring 5-6 mm in size were found in the transverse colon and descending colon.  A polypectomy was performed with a cold snare.  The resection was complete and the polyp tissue was completely retrieved.   A semi-pedunculated polyp measuring 1 cm in size was found in the sigmoid colon.  A polypectomy was performed using snare cautery.  The resection was complete and the polyp tissue was completely retrieved.   The mucosa appeared normal in the terminal ileum.   The colon was otherwise normal.  There was no diverticulosis, inflammation, polyps or cancers unless previously stated.  Retroflexed views revealed small  internal hemorrhoids. The time to cecum=3 minutes 00 seconds.  Withdrawal time=12 minutes 00 seconds.  The scope was withdrawn and the procedure completed. COMPLICATIONS: There were no complications. ENDOSCOPIC IMPRESSION: 1.   Two sessile polyps measuring 5-6 mm in the transverse and descending colon; polypectomy performed with a cold snare 2.   Semi-pedunculated polyp measuring 1 cm  in the sigmoid colon; polypectomy performed using snare cautery 3.   Normal mucosa in the terminal ileum 4.   Small internal hemorrhoids RECOMMENDATIONS: 1.  Hold aspirin, aspirin products, and anti-inflammatory medication for 2 weeks. 2.  Await pathology results 3.  Capsule endoscopy as outpatient 4.  Repeat colonoscopy with Dr. Ardis Hughs in 3 years if the largest polyp is adenomatous; 5 years if another is adenomatous; otherwise 10 years  eSigned:  Ladene Artist, MD, [facility] 10/23/2013 11:34 AM

## 2013-10-23 NOTE — Progress Notes (Signed)
          Daily Rounding Note  10/23/2013, 8:51 AM  LOS: 2 days   SUBJECTIVE:       Some red stool, than darker blood last night with prep.  Did have some red dye containing jello and icy in afternoon.  Stools now clear  OBJECTIVE:         Vital signs in last 24 hours:    Temp:  [98.3 F (36.8 C)-98.7 F (37.1 C)] 98.3 F (36.8 C) (05/18 0509) Pulse Rate:  [66-76] 71 (05/18 0509) Resp:  [18] 18 (05/18 0509) BP: (114-136)/(72-76) 129/76 mmHg (05/18 0509) SpO2:  [99 %-100 %] 100 % (05/18 0509) Last BM Date: 10/23/13 General: looks well, NAD.    Did not reexamine.   Intake/Output from previous day: 05/17 0701 - 05/18 0700 In: 432.9 [I.V.:432.9] Out: -   Intake/Output this shift:    Lab Results:  Recent Labs  10/21/13 1526 10/22/13 0359 10/23/13 0815  WBC 8.1 7.6 5.3  HGB 6.8* 8.8* 8.9*  HCT 21.8* 27.2* 27.3*  PLT 592* 478* 395  MCV   81  BMET  Recent Labs  10/21/13 1412 10/21/13 1526 10/22/13 0359  NA 138 136* 143  K 3.9 3.9 4.0  CL 104 99 106  CO2  --  24 25  GLUCOSE 101* 112* 95  BUN 7 9 8   CREATININE 0.80 0.69 0.64  CALCIUM  --  9.4 9.3   LFT  Recent Labs  10/21/13 1526 10/22/13 0359  PROT 8.0 7.1  ALBUMIN 4.0 3.5  AST 18 17  ALT 21 18  ALKPHOS 116 111  BILITOT <0.2* <0.2*   PT/INR  Recent Labs  10/21/13 1654  LABPROT 13.4  INR 1.04    ASSESMENT:   *   Normocytic anemia.  S/p 2 PRBCs.   *   Dark stools.  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   *  NIDDM, type 2.     PLAN   *  Colonoscopy today.    Vena Rua  10/23/2013, 8:51 AM Pager: [phone]     Attending physician's note   I have taken an interval history, reviewed the chart and examined the patient. I agree with the Advanced Practitioner's note, impression and recommendations.   Pricilla Riffle. Fuller Plan, MD [facility]

## 2013-10-23 NOTE — H&P (Signed)
Note: This document was prepared with digital dictation and possible smart phrase technology. Any transcriptional errors that result from this process are unintentional.   Jacqueline Dean GLO:[id number] DOB: Sep 12, 1950 DOA: 10/21/2013 PCP: [name], MD  Brief narrative: 63 y/o ?, known history brain aneurysm 2000 status post coiling, hypertension, diabetes mellitus non-insulin-dependent, partial hyst in 1990, and no other significant medical history is came to urgent care Center today with one to 2 weeks of malaise Heme occult positive/melanotic stool, hemoglobin 7.8 Transferred to inpatient as acute GI bleed   Past medical history-As per Problem list Chart reviewed as below- Review  Consultants:  Gastroenterology  Procedures:  Upper endoscopy 5/17 no findings  Antibiotics:  None   Subjective   Just had colonoscopy Hungry and wishing to eat and drink    Objective    Interim History: None  Telemetry: Sinus   Objective: Filed Vitals:   10/23/13 1200 10/23/13 1219 10/23/13 1442 10/23/13 1613  BP: 140/70 149/71 126/72   Pulse: 64 60 69   Temp:   98.2 F (36.8 C) 98.5 F (36.9 C)  TempSrc:   Oral Oral  Resp: 16 17 16 16   Height:      Weight:      SpO2: 100% 100% 100% 100%    Intake/Output Summary (Last 24 hours) at 10/23/13 1700 Last data filed at 10/23/13 1159  Gross per 24 hour  Intake    300 ml  Output      0 ml  Net    300 ml    Exam:  General: Alert pleasant oriented Cardiovascular: S1-S2 no murmur gallop Respiratory: Clinically clear Abdomen: Soft nontender no rebound or guarding Skin no lower extremity edema Neuro intact  Data Reviewed: Basic Metabolic Panel:  Recent Labs Lab 10/21/13 1412 10/21/13 1526 10/22/13 0359  NA 138 136* 143  K 3.9 3.9 4.0  CL 104 99 106  CO2  --  24 25  GLUCOSE 101* 112* 95  BUN 7 9 8   CREATININE 0.80 0.69 0.64  CALCIUM  --  9.4 9.3   Liver Function Tests:  Recent Labs Lab 10/21/13 1526  10/22/13 0359  AST 18 17  ALT 21 18  ALKPHOS 116 111  BILITOT <0.2* <0.2*  PROT 8.0 7.1  ALBUMIN 4.0 3.5   No results found for this basename: LIPASE, AMYLASE,  in the last 168 hours No results found for this basename: AMMONIA,  in the last 168 hours CBC:  Recent Labs Lab 10/21/13 1412 10/21/13 1526 10/22/13 0359 10/23/13 0815  WBC  --  8.1 7.6 5.3  HGB 7.8* 6.8* 8.8* 8.9*  HCT 23.0* 21.8* 27.2* 27.3*  MCV  --  80.7 81.4 82.5  PLT  --  592* 478* 395   Cardiac Enzymes: No results found for this basename: CKTOTAL, CKMB, CKMBINDEX, TROPONINI,  in the last 168 hours BNP: No components found with this basename: POCBNP,  CBG:  Recent Labs Lab 10/22/13 2128 10/22/13 2147 10/23/13 0752 10/23/13 0842 10/23/13 1226  GLUCAP 69* 83 104* 107* 82    No results found for this or any previous visit (from the past 240 hour(s)).   Studies:              All Imaging reviewed and is as per above notation   Scheduled Meds: . busPIRone  15 mg Oral TID  . diltiazem  240 mg Oral Daily  . insulin aspart  0-9 Units Subcutaneous TID WC  . nicotine  21 mg Transdermal Daily  .  phenytoin  100 mg Oral Daily  . phenytoin  400 mg Oral QHS  . pneumococcal 23 valent vaccine  0.5 mL Intramuscular Tomorrow-1000  . sodium chloride  3 mL Intravenous Q12H   Continuous Infusions: . sodium chloride 20 mL/hr at 10/23/13 0455  . lactated ringers 1,000 mL (10/23/13 1029)     Assessment/Plan:  1. GI bleed-likely lower GI source-Colonoscopy showed 2 sessile polyp 5-6 mm tr colon, Semi-pedunculated polyp 1 cm sigmoid.  Await path 2. Acute blood loss anemia-secondary to #1. Transfuse 2 units 5/16. Posttransfusion hematocrit stable. In 8 range with no drop.  Rpt am-if stable 3. History aneurysm and coiling 2000-stable currently. 4. Non-insulin-dependent diabetes mellitus-transition from every 4-4 times a day coverage without basal insulin for now as poor by mouth intake. Blood sugars  82-107 5. Anxiety-continue buspar 10 tid 6. Htn-not hypotensive and actually hypertensive so re-start diltiazem 240 daily.  Code Status: Full Family Communication: none bedside Disposition Plan: inpatient   Verneita Griffes, MD  [facility] Pager [phone] 10/23/2013, 5:00 PM    LOS: 2 days

## 2013-10-23 NOTE — ED Provider Notes (Signed)
Medical screening examination/treatment/procedure(s) were performed by a resident physician and as supervising physician I was immediately available for consultation/collaboration.  Philipp Deputy, M.D.  Harden Mo, MD 10/23/13 [phone]

## 2013-10-24 ENCOUNTER — Encounter (HOSPITAL_COMMUNITY): Payer: Self-pay | Admitting: Gastroenterology

## 2013-10-24 ENCOUNTER — Encounter: Payer: Self-pay | Admitting: Gastroenterology

## 2013-10-24 ENCOUNTER — Telehealth: Payer: Self-pay

## 2013-10-24 DIAGNOSIS — K922 Gastrointestinal hemorrhage, unspecified: Secondary | ICD-10-CM

## 2013-10-24 DIAGNOSIS — D649 Anemia, unspecified: Secondary | ICD-10-CM

## 2013-10-24 LAB — GLUCOSE, CAPILLARY: Glucose-Capillary: 110 mg/dL — ABNORMAL HIGH (ref 70–99)

## 2013-10-24 LAB — CBC WITH DIFFERENTIAL/PLATELET
Basophils Absolute: 0 10*3/uL (ref 0.0–0.1)
Basophils Relative: 0 % (ref 0–1)
EOS PCT: 1 % (ref 0–5)
Eosinophils Absolute: 0.1 10*3/uL (ref 0.0–0.7)
HEMATOCRIT: 28.5 % — AB (ref 36.0–46.0)
HEMOGLOBIN: 9.1 g/dL — AB (ref 12.0–15.0)
LYMPHS ABS: 1.4 10*3/uL (ref 0.7–4.0)
Lymphocytes Relative: 21 % (ref 12–46)
MCH: 26.3 pg (ref 26.0–34.0)
MCHC: 31.9 g/dL (ref 30.0–36.0)
MCV: 82.4 fL (ref 78.0–100.0)
MONOS PCT: 8 % (ref 3–12)
Monocytes Absolute: 0.5 10*3/uL (ref 0.1–1.0)
Neutro Abs: 4.7 10*3/uL (ref 1.7–7.7)
Neutrophils Relative %: 70 % (ref 43–77)
Platelets: 458 10*3/uL — ABNORMAL HIGH (ref 150–400)
RBC: 3.46 MIL/uL — ABNORMAL LOW (ref 3.87–5.11)
RDW: 15.9 % — ABNORMAL HIGH (ref 11.5–15.5)
WBC: 6.7 10*3/uL (ref 4.0–10.5)

## 2013-10-24 NOTE — Telephone Encounter (Signed)
Patient is still inpatient . She is scheduled with Sarah for capsule endo teaching for 10/31/13 2:00

## 2013-10-24 NOTE — Progress Notes (Signed)
          Daily Rounding Note  10/24/2013, 9:27 AM  LOS: 3 days   SUBJECTIVE:       Feels well, no BMs but is passing flatus since colonoscopy 5/18.  Tolerating solids.  Not dizzy, not SOB.  OBJECTIVE:         Vital signs in last 24 hours:    Temp:  [97.7 F (36.5 C)-98.5 F (36.9 C)] 98.3 F (36.8 C) (05/19 0625) Pulse Rate:  [60-78] 78 (05/18 1840) Resp:  [16-22] 16 (05/19 0625) BP: (121-156)/(60-82) 121/82 mmHg (05/18 1840) SpO2:  [99 %-100 %] 99 % (05/19 0625) Last BM Date: 10/23/13 General: looks well Heart: RRR.  No MRG Chest: clear, no labored breathing or cough Abdomen: soft, active BS, NT, ND.  Extremities: no CCE Neuro/Psych:  Oriented x 3.  Alert, relaxed.   Intake/Output from previous day: 05/18 0701 - 05/19 0700 In: 300 [I.V.:300] Out: -   Intake/Output this shift:    Lab Results:  Recent Labs  10/22/13 0359 10/23/13 0815 10/24/13 0752  WBC 7.6 5.3 6.7  HGB 8.8* 8.9* 9.1*  HCT 27.2* 27.3* 28.5*  PLT 478* 395 458*   BMET  Recent Labs  10/21/13 1412 10/21/13 1526 10/22/13 0359  NA 138 136* 143  K 3.9 3.9 4.0  CL 104 99 106  CO2  --  24 25  GLUCOSE 101* 112* 95  BUN 7 9 8   CREATININE 0.80 0.69 0.64  CALCIUM  --  9.4 9.3   LFT  Recent Labs  10/21/13 1526 10/22/13 0359  PROT 8.0 7.1  ALBUMIN 4.0 3.5  AST 18 17  ALT 21 18  ALKPHOS 116 111  BILITOT <0.2* <0.2*   PT/INR  Recent Labs  10/21/13 1654  LABPROT 13.4  INR 1.04    ASSESMENT:   * Normocytic anemia. S/p 2 PRBCs.  * Dark stools. FOBT +  EGD 5/17: normal  Colonoscopy 5/18: polyps in transverse, descending, sigmoid colon.  Removed and awaiting pathology.  Small internal hemorrhoids. Source of bleeding and anemia not clearly defined.  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  * NIDDM, type 2.      PLAN   *  RN visit with GI to explain and set her up for capsule endo.  5/26 at 2 PM. Ok to discharge home.  *  Hold  aspirin, aspirin products, and anti-inflammatory medication for 2 weeks.  Await pathology results.   *  Repeat colonoscopy with Dr. Ardis Hughs in 3 years if the largest polyp is adenomatous; 5 years if another is adenomatous; otherwise  10 years.    Vena Rua  10/24/2013, 9:27 AM Pager: [phone]     Attending physician's note   I have taken an interval history, reviewed the chart and examined the patient. I agree with the Advanced Practitioner's note, impression and recommendations. Capsule endoscopy as outpatient to further evaluate for a source of GI blood loss. GI follow up with Dr. Ardis Hughs.  Pricilla Riffle. Fuller Plan, MD [facility]

## 2013-10-24 NOTE — Telephone Encounter (Signed)
Message copied by Barron Alvine on Tue Oct 24, 2013 10:25 AM ------      Message from: Milus Banister      Created: Tue Oct 24, 2013  9:23 AM      Regarding: RE: scheduling VCE       I would like her to have the capsule set up.  Also would like her to have cbc checked here in 7 days.  Will communicate the findings of capsule and labs after they are back            Thanks            ----- Message -----         From: Vena Rua, PA-C         Sent: 10/23/2013   1:10 PM           To: Kellie Moor, RN, Milus Banister, MD      Subject: scheduling VCE                                           Hi Sheri      Dr Fuller Plan wants pt to have outpt capsule endo, can you set this up?  The pt has unexplained anemia and dark, FOBT + stools.  She was initially seen by Dr Ardis Hughs.  Not sure if he will want to see her back in office after VCE completed.  I am sending this note to him as well, so he can let you know about follow up.            Thanks, Sarah.        ------

## 2013-10-24 NOTE — Care Management Note (Signed)
    Page 1 of 1   10/24/2013     3:07:36 PM CARE MANAGEMENT NOTE 10/24/2013  Patient:  Jacqueline Dean, Jacqueline Dean   Account Number:  [id number]  Date Initiated:  10/24/2013  Documentation initiated by:  Tomi Bamberger  Subjective/Objective Assessment:   dx gib  admit- lives with sister.     Action/Plan:   Anticipated DC Date:  10/24/2013   Anticipated DC Plan:  HOME/SELF CARE      DC Planning Services  CM consult      Choice offered to / List presented to:             Status of service:  Completed, signed off Medicare Important Message given?   (If response is "NO", the following Medicare IM given date fields will be blank) Date Medicare IM given:   Date Additional Medicare IM given:  10/24/2013  Discharge Disposition:  HOME/SELF CARE  Per UR Regulation:  Reviewed for med. necessity/level of care/duration of stay  If discussed at [place] of Stay Meetings, dates discussed:    Comments:  10/24/13 [place], BSN [phone] patient for dc today, no needs anticipated.  Additonal IM given.

## 2013-10-24 NOTE — Progress Notes (Signed)
Jacqueline Dean to be D/C'd Home per MD order.  Discussed with the patient and all questions fully answered.    Medication List    STOP taking these medications       aspirin 81 MG tablet      TAKE these medications       busPIRone 15 MG tablet  Commonly known as:  BUSPAR  Take 15 mg by mouth 3 (three) times daily.     diltiazem 240 MG 24 hr capsule  Commonly known as:  CARDIZEM CD  Take 240 mg by mouth daily.     losartan-hydrochlorothiazide 100-12.5 MG per tablet  Commonly known as:  HYZAAR  Take 1 tablet by mouth daily.     multivitamin with minerals Tabs tablet  Take 1 tablet by mouth daily.     phenytoin 100 MG ER capsule  Commonly known as:  DILANTIN  Take 100-400 mg by mouth 2 (two) times daily. 100mg  in am, and 400mg  at bedtime        VVS, Skin clean, dry and intact without evidence of skin break down, no evidence of skin tears noted. IV catheter discontinued intact. Site without signs and symptoms of complications. Dressing and pressure applied.  An After Visit Summary was printed and given to the patient.  D/c education completed with patient/family including follow up instructions, medication list, d/c activities limitations if indicated, with other d/c instructions as indicated by MD - patient able to verbalize understanding, all questions fully answered.   Patient instructed to return to ED, call 911, or call MD for any changes in condition.   Patient escorted via [place], and D/C home via private auto.  Jacqueline Dean 10/24/2013 11:20 AM

## 2013-10-24 NOTE — Discharge Summary (Signed)
Physician Discharge Summary  Jacqueline Dean WNI:[id number] DOB: 08-May-1951 DOA: 10/21/2013  PCP: [name], MD  Admit date: 10/21/2013 Discharge date: 10/24/2013  Time spent: 24 minutes  Recommendations for Outpatient Follow-up:  1. Cbc 1 week 2. Consider Iron replacement  3. Follow pathology at GI office as op 4. NO aspirin for now   Discharge Diagnoses:  Principal Problem:   GI bleed Active Problems:   Type II or unspecified type diabetes mellitus without mention of complication, not stated as uncontrolled   Unspecified essential hypertension   Brain aneurysm   Smoker   Generalized anxiety disorder   Anemia   Constipation   Benign neoplasm of colon   Discharge Condition:    Diet recommendation: regular  Filed Weights   10/21/13 1513 10/21/13 1911  Weight: 74.844 kg (165 lb) 75.5 kg (166 lb 7.2 oz)    History of present illness:  63 y/o ?, known history brain aneurysm 2000 status post coiling, hypertension, diabetes mellitus non-insulin-dependent, partial hyst in 1990, and no other significant medical history is came to urgent care Center today with one to 2 weeks of malaise  Heme occult positive/melanotic stool, hemoglobin 7.8  Transfused 2 u prbc 5/16 GI was consulted-underwent both U endo and colonoscopy  Colonoscopy showed 2 sessile polyp 5-6 mm tr colon, Semi-pedunculated polyp 1 cm sigmoid. Await path She was kept of her ASa Hb was stable in 8-9 range Glycemic control was good in hospital, and she was re-implemented on htn and anxiety meds and d/c home after stability had been ensured   Discharge Exam: Filed Vitals:   10/24/13 0625  BP:   Pulse:   Temp: 98.3 F (36.8 C)  Resp: 16    General: alert pleasant oriented in NAD Cardiovascular: s1 s2 no m/r/g Respiratory: clear, no added sound  Discharge Instructions You were cared for by a hospitalist during your hospital stay. If you have any questions about your discharge medications or the care  you received while you were in the hospital after you are discharged, you can call the unit and asked to speak with the hospitalist on call if the hospitalist that took care of you is not available. Once you are discharged, your primary care physician will handle any further medical issues. Please note that NO REFILLS for any discharge medications will be authorized once you are discharged, as it is imperative that you return to your primary care physician (or establish a relationship with a primary care physician if you do not have one) for your aftercare needs so that they can reassess your need for medications and monitor your lab values.  Discharge Instructions   Diet - low sodium heart healthy    Complete by:  As directed      Discharge instructions    Complete by:  As directed   Follow up with GI for results of Polpy Get a blood count at regular PCP office in about 1 week     Increase activity slowly    Complete by:  As directed             Medication List    STOP taking these medications       aspirin 81 MG tablet      TAKE these medications       busPIRone 15 MG tablet  Commonly known as:  BUSPAR  Take 15 mg by mouth 3 (three) times daily.     diltiazem 240 MG 24 hr capsule  Commonly known as:  CARDIZEM  CD  Take 240 mg by mouth daily.     losartan-hydrochlorothiazide 100-12.5 MG per tablet  Commonly known as:  HYZAAR  Take 1 tablet by mouth daily.     multivitamin with minerals Tabs tablet  Take 1 tablet by mouth daily.     phenytoin 100 MG ER capsule  Commonly known as:  DILANTIN  Take 100-400 mg by mouth 2 (two) times daily. 100mg  in am, and 400mg  at bedtime       No Known Allergies    The results of significant diagnostics from this hospitalization (including imaging, microbiology, ancillary and laboratory) are listed below for reference.    Significant Diagnostic Studies: No results found.  Microbiology: No results found for this or any previous  visit (from the past 240 hour(s)).   Labs: Basic Metabolic Panel:  Recent Labs Lab 10/21/13 1412 10/21/13 1526 10/22/13 0359  NA 138 136* 143  K 3.9 3.9 4.0  CL 104 99 106  CO2  --  24 25  GLUCOSE 101* 112* 95  BUN 7 9 8   CREATININE 0.80 0.69 0.64  CALCIUM  --  9.4 9.3   Liver Function Tests:  Recent Labs Lab 10/21/13 1526 10/22/13 0359  AST 18 17  ALT 21 18  ALKPHOS 116 111  BILITOT <0.2* <0.2*  PROT 8.0 7.1  ALBUMIN 4.0 3.5   No results found for this basename: LIPASE, AMYLASE,  in the last 168 hours No results found for this basename: AMMONIA,  in the last 168 hours CBC:  Recent Labs Lab 10/21/13 1412 10/21/13 1526 10/22/13 0359 10/23/13 0815 10/24/13 0752  WBC  --  8.1 7.6 5.3 6.7  NEUTROABS  --   --   --   --  4.7  HGB 7.8* 6.8* 8.8* 8.9* 9.1*  HCT 23.0* 21.8* 27.2* 27.3* 28.5*  MCV  --  80.7 81.4 82.5 82.4  PLT  --  592* 478* 395 458*   Cardiac Enzymes: No results found for this basename: CKTOTAL, CKMB, CKMBINDEX, TROPONINI,  in the last 168 hours BNP: BNP (last 3 results) No results found for this basename: PROBNP,  in the last 8760 hours CBG:  Recent Labs Lab 10/23/13 0842 10/23/13 1226 10/23/13 1731 10/23/13 2214 10/24/13 0758  GLUCAP 107* 82 65* 123* 110*       Signed:  Jai-Gurmukh Fusako Tanabe  [facility] 10/24/2013, 9:10 AM

## 2013-10-24 NOTE — Telephone Encounter (Signed)
Message copied by Marlon Pel on Tue Oct 24, 2013  9:40 AM ------      Message from: Vena Rua      Created: Mon Oct 23, 2013  1:10 PM      Regarding: scheduling VCE       Hi Montserrat Shek      Dr Fuller Plan wants pt to have outpt capsule endo, can you set this up?  The pt has unexplained anemia and dark, FOBT + stools.  She was initially seen by Dr Ardis Hughs.  Not sure if he will want to see her back in office after VCE completed.  I am sending this note to him as well, so he can let you know about follow up.            Thanks, Sarah.  ------

## 2013-10-26 NOTE — Anesthesia Postprocedure Evaluation (Signed)
  Anesthesia Post-op Note  Patient: Jacqueline Dean  Procedure(s) Performed: Procedure(s): COLONOSCOPY WITH PROPOFOL (N/A)  Patient Location: Endoscopy Unit  Anesthesia Type:MAC  Level of Consciousness: awake, alert , oriented and patient cooperative  Airway and Oxygen Therapy: Patient Spontanous Breathing and Patient connected to nasal cannula oxygen  Post-op Pain: none  Post-op Assessment: Post-op Vital signs reviewed, Patient's Cardiovascular Status Stable, Respiratory Function Stable, Patent Airway and No signs of Nausea or vomiting  Post-op Vital Signs: Reviewed and stable  Last Vitals:  Filed Vitals:   10/24/13 1115  BP: 131/88  Pulse:   Temp:   Resp:     Complications: No apparent anesthesia complications

## 2013-10-26 NOTE — Transfer of Care (Signed)
Immediate Anesthesia Transfer of Care Note  Patient: Jacqueline Dean  Procedure(s) Performed: Procedure(s): COLONOSCOPY WITH PROPOFOL (N/A)  Patient Location: Endoscopy Unit  Anesthesia Type:MAC  Level of Consciousness: awake, alert , oriented and patient cooperative  Airway & Oxygen Therapy: Patient Spontanous Breathing and Patient connected to nasal cannula oxygen  Post-op Assessment: Report given to PACU RN, Post -op Vital signs reviewed and stable and Patient moving all extremities X 4  Post vital signs: Reviewed and stable  Complications: No apparent anesthesia complications

## 2013-10-27 ENCOUNTER — Encounter (HOSPITAL_COMMUNITY): Payer: Self-pay | Admitting: Gastroenterology

## 2013-10-31 ENCOUNTER — Ambulatory Visit (INDEPENDENT_AMBULATORY_CARE_PROVIDER_SITE_OTHER): Payer: PRIVATE HEALTH INSURANCE | Admitting: Gastroenterology

## 2013-10-31 ENCOUNTER — Other Ambulatory Visit: Payer: Self-pay

## 2013-10-31 DIAGNOSIS — K922 Gastrointestinal hemorrhage, unspecified: Secondary | ICD-10-CM

## 2013-10-31 DIAGNOSIS — D649 Anemia, unspecified: Secondary | ICD-10-CM

## 2013-10-31 NOTE — Progress Notes (Signed)
Patient ID: Jacqueline Dean, female   DOB: Nov 15, 1950, 63 y.o.   MRN: [id number] Patient here today for capsule endo teaching.  She verbalized understanding of all written and verbal instructions

## 2013-11-06 ENCOUNTER — Ambulatory Visit (INDEPENDENT_AMBULATORY_CARE_PROVIDER_SITE_OTHER): Payer: PRIVATE HEALTH INSURANCE | Admitting: Gastroenterology

## 2013-11-06 DIAGNOSIS — D500 Iron deficiency anemia secondary to blood loss (chronic): Secondary | ICD-10-CM

## 2013-11-06 NOTE — Progress Notes (Signed)
Pt here for capsule endo. Pt prepped following her instructions. Pt able to swallow the capsule without problems, pt tolerated procedure well.  Lot # [id number] Exp:  10/2014

## 2013-11-17 ENCOUNTER — Encounter: Payer: Self-pay | Admitting: Gastroenterology

## 2013-11-21 ENCOUNTER — Telehealth: Payer: Self-pay | Admitting: Gastroenterology

## 2013-11-21 DIAGNOSIS — K922 Gastrointestinal hemorrhage, unspecified: Secondary | ICD-10-CM

## 2013-11-21 NOTE — Telephone Encounter (Signed)
(  The inflammation was descirbed as "severe" and in duodenum vs. Proximal jejunum without discrete ulcer or bleeding.

## 2013-11-21 NOTE — Telephone Encounter (Signed)
Please call her.  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  No active or recent bleeding.  Certainly not tumors notes.  She needs rov with me in 6-7 weeks, CBC a few days prior.  thanks

## 2013-11-21 NOTE — Telephone Encounter (Signed)
See alternate note  

## 2013-11-21 NOTE — Telephone Encounter (Signed)
Left message on machine to call back  

## 2013-11-23 NOTE — Telephone Encounter (Signed)
The pt has been notified and will call back to schedule follow up, labs in [facility]

## 2014-01-04 ENCOUNTER — Telehealth: Payer: Self-pay

## 2014-01-04 DIAGNOSIS — D509 Iron deficiency anemia, unspecified: Secondary | ICD-10-CM

## 2014-01-04 NOTE — Telephone Encounter (Signed)
Left message on machine to call back  

## 2014-01-04 NOTE — Telephone Encounter (Signed)
Message copied by Barron Alvine on Thu Jan 04, 2014  8:13 AM ------      Message from: Barron Alvine      Created: Thu Nov 23, 2013  8:11 AM       Pt to get labs and have follow up see 11/23/13 note  ------

## 2014-01-08 ENCOUNTER — Other Ambulatory Visit (INDEPENDENT_AMBULATORY_CARE_PROVIDER_SITE_OTHER): Payer: PRIVATE HEALTH INSURANCE

## 2014-01-08 DIAGNOSIS — D509 Iron deficiency anemia, unspecified: Secondary | ICD-10-CM

## 2014-01-08 LAB — CBC WITH DIFFERENTIAL/PLATELET
BASOS PCT: 0.5 % (ref 0.0–3.0)
Basophils Absolute: 0 10*3/uL (ref 0.0–0.1)
EOS PCT: 0.7 % (ref 0.0–5.0)
Eosinophils Absolute: 0 10*3/uL (ref 0.0–0.7)
HCT: 40.5 % (ref 36.0–46.0)
Hemoglobin: 13.1 g/dL (ref 12.0–15.0)
LYMPHS ABS: 1.6 10*3/uL (ref 0.7–4.0)
Lymphocytes Relative: 22.7 % (ref 12.0–46.0)
MCHC: 32.3 g/dL (ref 30.0–36.0)
MCV: 78.4 fl (ref 78.0–100.0)
MONO ABS: 0.5 10*3/uL (ref 0.1–1.0)
MONOS PCT: 7 % (ref 3.0–12.0)
Neutro Abs: 5 10*3/uL (ref 1.4–7.7)
Neutrophils Relative %: 69.1 % (ref 43.0–77.0)
Platelets: 295 10*3/uL (ref 150.0–400.0)
RBC: 5.16 Mil/uL — AB (ref 3.87–5.11)
RDW: 23.3 % — ABNORMAL HIGH (ref 11.5–15.5)
WBC: 7.2 10*3/uL (ref 4.0–10.5)

## 2014-01-09 ENCOUNTER — Ambulatory Visit (INDEPENDENT_AMBULATORY_CARE_PROVIDER_SITE_OTHER): Payer: PRIVATE HEALTH INSURANCE | Admitting: Gastroenterology

## 2014-01-09 ENCOUNTER — Encounter: Payer: Self-pay | Admitting: Gastroenterology

## 2014-01-09 VITALS — BP 118/74 | HR 80 | Ht 62.0 in | Wt 160.8 lb

## 2014-01-09 DIAGNOSIS — R195 Other fecal abnormalities: Secondary | ICD-10-CM

## 2014-01-09 DIAGNOSIS — K921 Melena: Secondary | ICD-10-CM

## 2014-01-09 NOTE — Patient Instructions (Addendum)
Change iron pill to every other day for 2 months. Repeat CBC, iron, ferritin in 6 months. Call if you have dark black stools again.

## 2014-01-09 NOTE — Progress Notes (Signed)
Review of pertinent gastrointestinal problems: 1. Melena, anemia: Hemoglobin 6.5, normal MCV may 2015 when she presented to the hospital. EGD Dr. Ardis Hughs may 2015 was normal. Colonoscopy Dr. Fuller Plan may 2015 found 3 adenomatous polyps, recall colonoscopy set for 3 years. Capsule endoscopy June 2015 showed no clear cause of her anemia, melena (there were inflammatory changes at 17min but no over bleeding). Had started 81mg  asa about 2 weeks prior.   2. adenomatous colon polyps, colonoscopy Dr. Fuller Plan 2015. Recall may 2018  HPI: This is a    very pleasant 63 year old woman whom I last saw when she was hospitalized about 2 months ago.  No more dark stools.  Has been taking one iron pill once daily since d/c.  Dark stools but not black like the melena.  Feels fine.   Past Medical History  Diagnosis Date  . Diabetes mellitus without complication   . Hypertension   . Brain aneurysm 2000    Past Surgical History  Procedure Laterality Date  . Abdominal hysterectomy      partial  . Brain surgery  2000  . Goiter    . Ventriculoperitoneal shunt  2001  . Esophagogastroduodenoscopy N/A 10/22/2013    Procedure: ESOPHAGOGASTRODUODENOSCOPY (EGD);  Surgeon: Milus Banister, MD;  Location: [facility];  Service: Endoscopy;  Laterality: N/A;  . Colonoscopy with propofol N/A 10/23/2013    Procedure: COLONOSCOPY WITH PROPOFOL;  Surgeon: Ladene Artist, MD;  Location: [facility] ENDOSCOPY;  Service: Endoscopy;  Laterality: N/A;    Current Outpatient Prescriptions  Medication Sig Dispense Refill  . busPIRone (BUSPAR) 15 MG tablet Take 15 mg by mouth 3 (three) times daily.      Marland Kitchen diltiazem (CARDIZEM CD) 240 MG 24 hr capsule Take 240 mg by mouth daily.      Marland Kitchen losartan-hydrochlorothiazide (HYZAAR) 100-12.5 MG per tablet Take 1 tablet by mouth daily.      . Multiple Vitamin (MULTIVITAMIN WITH MINERALS) TABS tablet Take 1 tablet by mouth daily.      . phenytoin (DILANTIN) 100 MG ER capsule Take 100-400 mg by mouth  2 (two) times daily. 100mg  in am, and 400mg  at bedtime       No current facility-administered medications for this visit.    Allergies as of 01/09/2014  . (No Known Allergies)    Family History  Problem Relation Age of Onset  . Hypertension Mother   . Diabetes Mother   . Heart disease Mother   . Hypertension Sister   . Stroke Mother     Bed at age 62  . Heart attack Father     Died at age 15 with this    History   Social History  . Marital Status: Widowed    Spouse Name: N/A    Number of Children: N/A  . Years of Education: N/A   Occupational History  . Not on file.   Social History Main Topics  . Smoking status: Current Every Day Smoker    Types: Cigarettes  . Smokeless tobacco: Not on file  . Alcohol Use: No  . Drug Use: No  . Sexual Activity: Not on file   Other Topics Concern  . Not on file   Social History Narrative   Currently disabled since may of 2000 when she had her aneurysm surgery in [place] coiling of the ureter. 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 She smokes pot half pack per day but has been smoking less recently   She drinks occasionally   She  does not do drugs      Gynecological history G0 P0 secondary to fibroidectomy 1990      Physical Exam: BP 118/74  Pulse 80  Ht 5\' 2"  (1.575 m)  Wt 160 lb 12.8 oz (72.938 kg)  BMI 29.40 kg/m2 Constitutional: generally well-appearing Psychiatric: alert and oriented x3 Abdomen: soft, nontender, nondistended, no obvious ascites, no peritoneal signs, normal bowel sounds     Assessment and plan: 63 y.o. female with  recent melena of unclear etiology  She had started baby aspirin about 2 weeks before she presented with melena, anemia.  Perhaps it was contributing. She did have colon polyps however I doubt any of those were contributing or causing her anemia. Certainly no significant findings were noted on colonoscopy, upper endoscopy or capsule endoscopy. Her anemia has completely  resolved with once daily iron supplements. She has a bit of an upset stomach and some constipation and so on having her decrease her iron to every other day for 2 months and then she'll stop. She will have repeat CBC and iron studies in about 6 months. She knows to call here she has GI bleeding again.

## 2014-07-12 ENCOUNTER — Telehealth: Payer: Self-pay

## 2014-07-12 NOTE — Telephone Encounter (Signed)
Pt has been reminded to have labs  

## 2014-07-12 NOTE — Telephone Encounter (Signed)
-----   Message from Barron Alvine, [place] sent at 01/09/2014  2:59 PM EDT ----- Pt to get labs

## 2014-07-13 ENCOUNTER — Other Ambulatory Visit (INDEPENDENT_AMBULATORY_CARE_PROVIDER_SITE_OTHER): Payer: 59

## 2014-07-13 DIAGNOSIS — K921 Melena: Secondary | ICD-10-CM | POA: Diagnosis not present

## 2014-07-13 LAB — CBC WITH DIFFERENTIAL/PLATELET
BASOS ABS: 0 10*3/uL (ref 0.0–0.1)
BASOS PCT: 0.5 % (ref 0.0–3.0)
EOS ABS: 0 10*3/uL (ref 0.0–0.7)
Eosinophils Relative: 0.6 % (ref 0.0–5.0)
HCT: 38.6 % (ref 36.0–46.0)
Hemoglobin: 13 g/dL (ref 12.0–15.0)
Lymphocytes Relative: 28.6 % (ref 12.0–46.0)
Lymphs Abs: 1.6 10*3/uL (ref 0.7–4.0)
MCHC: 33.5 g/dL (ref 30.0–36.0)
MCV: 80.9 fl (ref 78.0–100.0)
MONOS PCT: 9.4 % (ref 3.0–12.0)
Monocytes Absolute: 0.5 10*3/uL (ref 0.1–1.0)
Neutro Abs: 3.4 10*3/uL (ref 1.4–7.7)
Neutrophils Relative %: 60.9 % (ref 43.0–77.0)
Platelets: 278 10*3/uL (ref 150.0–400.0)
RBC: 4.77 Mil/uL (ref 3.87–5.11)
RDW: 15.3 % (ref 11.5–15.5)
WBC: 5.6 10*3/uL (ref 4.0–10.5)

## 2014-07-13 LAB — FERRITIN: Ferritin: 111.2 ng/mL (ref 10.0–291.0)

## 2014-07-13 LAB — IRON: Iron: 84 ug/dL (ref 42–145)
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
## 2016-09-03 ENCOUNTER — Encounter: Payer: Self-pay | Admitting: Gastroenterology

## 2021-04-29 ENCOUNTER — Other Ambulatory Visit: Payer: Self-pay

## 2021-04-29 ENCOUNTER — Ambulatory Visit (HOSPITAL_COMMUNITY)
Admission: EM | Admit: 2021-04-29 | Discharge: 2021-04-29 | Disposition: A | Discharge status: Home / Self Care | Payer: Medicare (Managed Care) | Attending: Emergency Medicine | Admitting: Emergency Medicine

## 2021-04-29 ENCOUNTER — Encounter (HOSPITAL_COMMUNITY): Payer: Self-pay

## 2021-04-29 DIAGNOSIS — M62838 Other muscle spasm: Secondary | ICD-10-CM | POA: Diagnosis not present

## 2021-04-29 DIAGNOSIS — M5432 Sciatica, left side: Secondary | ICD-10-CM | POA: Diagnosis not present

## 2021-04-29 MED ORDER — TIZANIDINE HCL 4 MG PO TABS: 4.0000 mg | ORAL_TABLET | Freq: Three times a day (TID) | ORAL | 0 refills | Status: AC | PRN

## 2021-04-29 MED ORDER — IBUPROFEN 600 MG PO TABS: 600.0000 mg | ORAL_TABLET | Freq: Four times a day (QID) | ORAL | 0 refills | Status: AC | PRN

## 2021-04-29 NOTE — Discharge Instructions (Addendum)
Take 600 mg of ibuprofen combined with 1000 mg of Tylenol together 3-4 times a day as needed for pain.  Continue Epson salt soaks.  Zanaflex will help with muscle relaxants.  I am hesitant to start you on steroids because of the diabetes.  Below is a list of primary care practices who are taking new patients for you to follow-up with.  [facility] internal medicine clinic Ground Floor - [facility], [place], [place], [place] [address] [phone]  [facility] Primary Care at [facility] [address] [place], [place] [address] [phone]  [place] and [facility] [place], [place] [address] [phone]  [facility]/Internal Medicine [phone] [place] [address]   family Practice Center: [place]  [phone]  [facility] Family Medicine: [address] [place]  [phone]  [place] primary care : [address] [phone]  [facility] Primary Care: [address] [place] [address] [phone]  [facility]: [place] [phone]  Dr. Blanchie Serve [place] [address]  [phone]  Go to www.goodrx.com  or www.costplusdrugs.com to look up your medications. This will give you a list of where you can find your prescriptions at the most affordable prices. Or ask the pharmacist what the cash price is, or if they have any other discount programs available to help make your medication more affordable. This can be less expensive than what you would pay with insurance.

## 2021-04-29 NOTE — ED Triage Notes (Signed)
Pt presents with left side sciatic pain from lower back radiating down her leg since last night.

## 2021-04-29 NOTE — ED Provider Notes (Signed)
HPI  SUBJECTIVE:  Jacqueline Dean is a 70 y.o. female who presents with left buttock pain that radiates down the back of her leg and with muscle spasms starting last night.  No recent heavy lifting, change in physical activity.  No saddle anesthesia, urinary, fecal incontinence, urinary retention, leg weakness, distal numbness or tingling, bilateral radicular pain, fevers, rash.  She has tried ibuprofen 800 mg with improvement in her symptoms.  Symptoms are worse with bending over, stooping, sitting for prolonged periods of time.  No antipyretic in the past 6 hours.  She has had identical symptoms before which was found to be due to sciatica.  She has a past medical history of diabetes and hypertension, and remote history of GI bleed from polyps.  She states that she is allowed to take NSAIDs.  She has a history of brain aneurysm, ventriculoperitoneal shunt.  She takes anticonvulsants due to the brain aneurysm, no diagnosis of epilepsy.  No history of of peptic ulcer disease.  PMD: None.  Past Medical History:  Diagnosis Date   Brain aneurysm 2000   Diabetes mellitus without complication ([place])    Hypertension     Past Surgical History:  Procedure Laterality Date   ABDOMINAL HYSTERECTOMY     partial   BRAIN SURGERY  2000   COLONOSCOPY WITH PROPOFOL N/A 10/23/2013   Procedure: COLONOSCOPY WITH PROPOFOL;  Surgeon: Ladene Artist, MD;  Location: [facility] ENDOSCOPY;  Service: Endoscopy;  Laterality: N/A;   ESOPHAGOGASTRODUODENOSCOPY N/A 10/22/2013   Procedure: ESOPHAGOGASTRODUODENOSCOPY (EGD);  Surgeon: Milus Banister, MD;  Location: [place];  Service: Endoscopy;  Laterality: N/A;   goiter     VENTRICULOPERITONEAL SHUNT  2001    Family History  Problem Relation Age of Onset   Hypertension Mother    Diabetes Mother    Heart disease Mother    Hypertension Sister    Stroke Mother        Bed at age 75   Heart attack Father        Died at age 41 with this    Social History   Tobacco Use    Smoking status: Every Day    Types: Cigarettes  Substance Use Topics   Alcohol use: No   Drug use: No    No current facility-administered medications for this encounter.  Current Outpatient Medications:    ibuprofen (ADVIL) 600 MG tablet, Take 1 tablet (600 mg total) by mouth every 6 (six) hours as needed., Disp: 30 tablet, Rfl: 0   tiZANidine (ZANAFLEX) 4 MG tablet, Take 1 tablet (4 mg total) by mouth every 8 (eight) hours as needed for muscle spasms., Disp: 30 tablet, Rfl: 0   busPIRone (BUSPAR) 15 MG tablet, Take 15 mg by mouth 3 (three) times daily., Disp: , Rfl:    diltiazem (CARDIZEM CD) 240 MG 24 hr capsule, Take 240 mg by mouth daily., Disp: , Rfl:    losartan-hydrochlorothiazide (HYZAAR) 100-12.5 MG per tablet, Take 1 tablet by mouth daily., Disp: , Rfl:    Multiple Vitamin (MULTIVITAMIN WITH MINERALS) TABS tablet, Take 1 tablet by mouth daily., Disp: , Rfl:    phenytoin (DILANTIN) 100 MG ER capsule, Take 100-400 mg by mouth 2 (two) times daily. 100mg  in am, and 400mg  at bedtime, Disp: , Rfl:   No Known Allergies   ROS  As noted in HPI.   Physical Exam  BP (!) 161/68 (BP Location: Right Arm)   Pulse 77   Temp 98.6 F (37 C) (Oral)  Resp 17   SpO2 96%   Constitutional: Well developed, well nourished, no acute distress Eyes:  EOMI, conjunctiva normal bilaterally HENT: Normocephalic, atraumatic,mucus membranes moist Respiratory: Normal inspiratory effort Cardiovascular: Normal rate GI: nondistended.  skin: No rash, skin intact Musculoskeletal: no CVAT.  No paralumbar tenderness, positive tenderness, muscle spasm of her buttock.  No L-spine, SI joint bony tenderness. Bilateral lower extremities nontender, baseline ROM with intact DP. No pain with int/ext rotation flex/extension hips bilaterally. SLR neg bilaterally. Sensation baseline light touch bilaterally for Pt, DTR's symmetric and intact bilaterally KJ,  Motor symmetric bilateral 5/5 hip flexion, quadriceps,  hamstrings, EHL, foot dorsiflexion, foot plantarflexion, gait normal.  No tenderness along the sciatic notch. Neurologic: Alert & oriented x 3, no focal neuro deficits Psychiatric: Speech and behavior appropriate   ED Course   Medications - No data to display  No orders of the defined types were placed in this encounter.   No results found for this or any previous visit (from the past 24 hour(s)). No results found.  ED Clinical Impression  1. Sciatica of left side   2. Muscle spasm     ED Assessment/Plan  Patient with a flare of her sciatica.  Hesitant to prescribe steroids because of the diabetes.  Will send home with Tylenol and ibuprofen.  She states that the GI bleed was from polyps, and that she is allowed to take ibuprofen.  Advised her to be careful with this.  Zanaflex for muscle spasm, continue Epson salt soaks.  Work note for 2 days.  Will provide primary care list and order assistance in finding a PMD.  ER return precautions given.  Discussed MDM, treatment plan, and plan for follow-up with patient. Discussed sn/sx that should prompt return to the ED. patient agrees with plan.   Meds ordered this encounter  Medications   tiZANidine (ZANAFLEX) 4 MG tablet    Sig: Take 1 tablet (4 mg total) by mouth every 8 (eight) hours as needed for muscle spasms.    Dispense:  30 tablet    Refill:  0   ibuprofen (ADVIL) 600 MG tablet    Sig: Take 1 tablet (600 mg total) by mouth every 6 (six) hours as needed.    Dispense:  30 tablet    Refill:  0    *This clinic note was created using [employer]. Therefore, there may be occasional mistakes despite careful proofreading.  ?     Melynda Ripple, MD 04/30/21 [phone]
